# Patient Record
Sex: Female | Born: 1975 | Race: Black or African American | Hispanic: No | Marital: Single | Smoking: Former smoker
Health system: Southern US, Community
[De-identification: ages and names within clinical notes are randomized; demographics above are authoritative.]

## PROBLEM LIST (undated history)

## (undated) DIAGNOSIS — IMO0002 Reserved for concepts with insufficient information to code with codable children: Secondary | ICD-10-CM

## (undated) DIAGNOSIS — IMO0001 Reserved for inherently not codable concepts without codable children: Secondary | ICD-10-CM

## (undated) DIAGNOSIS — G43909 Migraine, unspecified, not intractable, without status migrainosus: Secondary | ICD-10-CM

## (undated) DIAGNOSIS — C801 Malignant (primary) neoplasm, unspecified: Secondary | ICD-10-CM

## (undated) DIAGNOSIS — G43019 Migraine without aura, intractable, without status migrainosus: Secondary | ICD-10-CM

## (undated) DIAGNOSIS — K219 Gastro-esophageal reflux disease without esophagitis: Secondary | ICD-10-CM

## (undated) DIAGNOSIS — Z972 Presence of dental prosthetic device (complete) (partial): Secondary | ICD-10-CM

## (undated) HISTORY — PX: DILATION AND CURETTAGE OF UTERUS: SHX78

## (undated) HISTORY — PX: MULTIPLE TOOTH EXTRACTIONS: SHX2053

## (undated) HISTORY — DX: Migraine without aura, intractable, without status migrainosus: G43.019

---

## 1997-12-01 ENCOUNTER — Inpatient Hospital Stay (HOSPITAL_COMMUNITY): Admission: AD | Admit: 1997-12-01 | Discharge: 1997-12-01 | Payer: Self-pay | Admitting: *Deleted

## 1998-02-23 ENCOUNTER — Inpatient Hospital Stay (HOSPITAL_COMMUNITY): Admission: AD | Admit: 1998-02-23 | Discharge: 1998-02-23 | Payer: Self-pay | Admitting: Obstetrics

## 1998-11-06 ENCOUNTER — Inpatient Hospital Stay (HOSPITAL_COMMUNITY): Admission: AD | Admit: 1998-11-06 | Discharge: 1998-11-06 | Payer: Self-pay | Admitting: Obstetrics & Gynecology

## 1998-11-09 ENCOUNTER — Inpatient Hospital Stay (HOSPITAL_COMMUNITY): Admission: AD | Admit: 1998-11-09 | Discharge: 1998-11-09 | Payer: Self-pay | Admitting: Obstetrics

## 1999-12-11 ENCOUNTER — Emergency Department (HOSPITAL_COMMUNITY): Admission: EM | Admit: 1999-12-11 | Discharge: 1999-12-11 | Payer: Self-pay | Admitting: Emergency Medicine

## 1999-12-11 ENCOUNTER — Encounter: Payer: Self-pay | Admitting: Emergency Medicine

## 2000-01-18 ENCOUNTER — Inpatient Hospital Stay (HOSPITAL_COMMUNITY): Admission: AD | Admit: 2000-01-18 | Discharge: 2000-01-18 | Payer: Self-pay | Admitting: *Deleted

## 2000-01-20 ENCOUNTER — Inpatient Hospital Stay (HOSPITAL_COMMUNITY): Admission: AD | Admit: 2000-01-20 | Discharge: 2000-01-20 | Payer: Self-pay | Admitting: *Deleted

## 2000-03-11 ENCOUNTER — Inpatient Hospital Stay (HOSPITAL_COMMUNITY): Admission: AD | Admit: 2000-03-11 | Discharge: 2000-03-11 | Payer: Self-pay | Admitting: *Deleted

## 2000-05-05 ENCOUNTER — Emergency Department (HOSPITAL_COMMUNITY): Admission: EM | Admit: 2000-05-05 | Discharge: 2000-05-05 | Payer: Self-pay

## 2000-05-13 ENCOUNTER — Inpatient Hospital Stay (HOSPITAL_COMMUNITY): Admission: AD | Admit: 2000-05-13 | Discharge: 2000-05-13 | Payer: Self-pay | Admitting: *Deleted

## 2000-07-19 ENCOUNTER — Inpatient Hospital Stay (HOSPITAL_COMMUNITY): Admission: AD | Admit: 2000-07-19 | Discharge: 2000-07-19 | Payer: Self-pay | Admitting: *Deleted

## 2001-02-06 ENCOUNTER — Inpatient Hospital Stay (HOSPITAL_COMMUNITY): Admission: AD | Admit: 2001-02-06 | Discharge: 2001-02-06 | Payer: Self-pay | Admitting: *Deleted

## 2001-03-21 ENCOUNTER — Inpatient Hospital Stay (HOSPITAL_COMMUNITY): Admission: AD | Admit: 2001-03-21 | Discharge: 2001-03-21 | Payer: Self-pay | Admitting: *Deleted

## 2001-04-06 ENCOUNTER — Emergency Department (HOSPITAL_COMMUNITY): Admission: EM | Admit: 2001-04-06 | Discharge: 2001-04-06 | Payer: Self-pay | Admitting: Emergency Medicine

## 2001-06-07 ENCOUNTER — Encounter: Payer: Self-pay | Admitting: Obstetrics & Gynecology

## 2001-06-07 ENCOUNTER — Inpatient Hospital Stay (HOSPITAL_COMMUNITY): Admission: AD | Admit: 2001-06-07 | Discharge: 2001-06-07 | Payer: Self-pay | Admitting: Obstetrics & Gynecology

## 2001-06-12 ENCOUNTER — Encounter (INDEPENDENT_AMBULATORY_CARE_PROVIDER_SITE_OTHER): Payer: Self-pay | Admitting: Specialist

## 2001-06-12 ENCOUNTER — Observation Stay (HOSPITAL_COMMUNITY): Admission: AD | Admit: 2001-06-12 | Discharge: 2001-06-12 | Payer: Self-pay | Admitting: Obstetrics & Gynecology

## 2001-06-12 HISTORY — PX: DILATION AND EVACUATION: SHX1459

## 2002-02-03 ENCOUNTER — Inpatient Hospital Stay (HOSPITAL_COMMUNITY): Admission: AD | Admit: 2002-02-03 | Discharge: 2002-02-03 | Payer: Self-pay | Admitting: Obstetrics and Gynecology

## 2002-02-28 ENCOUNTER — Other Ambulatory Visit: Admission: RE | Admit: 2002-02-28 | Discharge: 2002-02-28 | Payer: Self-pay | Admitting: Obstetrics and Gynecology

## 2002-09-18 ENCOUNTER — Inpatient Hospital Stay (HOSPITAL_COMMUNITY): Admission: AD | Admit: 2002-09-18 | Discharge: 2002-09-18 | Payer: Self-pay | Admitting: Obstetrics and Gynecology

## 2002-09-22 ENCOUNTER — Inpatient Hospital Stay (HOSPITAL_COMMUNITY): Admission: AD | Admit: 2002-09-22 | Discharge: 2002-09-22 | Payer: Self-pay | Admitting: Obstetrics and Gynecology

## 2002-09-25 ENCOUNTER — Inpatient Hospital Stay (HOSPITAL_COMMUNITY): Admission: AD | Admit: 2002-09-25 | Discharge: 2002-09-27 | Payer: Self-pay | Admitting: Obstetrics and Gynecology

## 2003-05-08 ENCOUNTER — Other Ambulatory Visit: Admission: RE | Admit: 2003-05-08 | Discharge: 2003-05-08 | Payer: Self-pay | Admitting: Obstetrics and Gynecology

## 2003-07-07 ENCOUNTER — Inpatient Hospital Stay (HOSPITAL_COMMUNITY): Admission: AD | Admit: 2003-07-07 | Discharge: 2003-07-07 | Payer: Self-pay | Admitting: Obstetrics and Gynecology

## 2003-07-14 ENCOUNTER — Emergency Department (HOSPITAL_COMMUNITY): Admission: EM | Admit: 2003-07-14 | Discharge: 2003-07-14 | Payer: Self-pay | Admitting: Emergency Medicine

## 2003-11-27 ENCOUNTER — Emergency Department (HOSPITAL_COMMUNITY): Admission: EM | Admit: 2003-11-27 | Discharge: 2003-11-28 | Payer: Self-pay

## 2004-01-13 ENCOUNTER — Emergency Department (HOSPITAL_COMMUNITY): Admission: EM | Admit: 2004-01-13 | Discharge: 2004-01-14 | Payer: Self-pay | Admitting: Emergency Medicine

## 2004-04-22 ENCOUNTER — Emergency Department (HOSPITAL_COMMUNITY): Admission: EM | Admit: 2004-04-22 | Discharge: 2004-04-22 | Payer: Self-pay

## 2004-12-01 ENCOUNTER — Inpatient Hospital Stay (HOSPITAL_COMMUNITY): Admission: AD | Admit: 2004-12-01 | Discharge: 2004-12-01 | Payer: Self-pay | Admitting: Family Medicine

## 2005-02-03 ENCOUNTER — Inpatient Hospital Stay (HOSPITAL_COMMUNITY): Admission: AD | Admit: 2005-02-03 | Discharge: 2005-02-03 | Payer: Self-pay | Admitting: Family Medicine

## 2005-08-24 ENCOUNTER — Inpatient Hospital Stay (HOSPITAL_COMMUNITY): Admission: AD | Admit: 2005-08-24 | Discharge: 2005-08-24 | Payer: Self-pay | Admitting: Obstetrics

## 2005-09-24 ENCOUNTER — Inpatient Hospital Stay (HOSPITAL_COMMUNITY): Admission: AD | Admit: 2005-09-24 | Discharge: 2005-09-26 | Payer: Self-pay | Admitting: Obstetrics

## 2005-09-24 ENCOUNTER — Encounter (INDEPENDENT_AMBULATORY_CARE_PROVIDER_SITE_OTHER): Payer: Self-pay | Admitting: *Deleted

## 2005-09-25 ENCOUNTER — Encounter (INDEPENDENT_AMBULATORY_CARE_PROVIDER_SITE_OTHER): Payer: Self-pay | Admitting: Specialist

## 2005-09-25 HISTORY — PX: TUBAL LIGATION: SHX77

## 2006-04-08 ENCOUNTER — Emergency Department (HOSPITAL_COMMUNITY): Admission: EM | Admit: 2006-04-08 | Discharge: 2006-04-08 | Payer: Self-pay | Admitting: Emergency Medicine

## 2006-11-23 ENCOUNTER — Emergency Department (HOSPITAL_COMMUNITY): Admission: EM | Admit: 2006-11-23 | Discharge: 2006-11-23 | Payer: Self-pay | Admitting: Emergency Medicine

## 2007-04-28 ENCOUNTER — Emergency Department (HOSPITAL_COMMUNITY): Admission: EM | Admit: 2007-04-28 | Discharge: 2007-04-29 | Payer: Self-pay | Admitting: Emergency Medicine

## 2007-07-31 ENCOUNTER — Emergency Department (HOSPITAL_COMMUNITY): Admission: EM | Admit: 2007-07-31 | Discharge: 2007-07-31 | Payer: Self-pay | Admitting: Emergency Medicine

## 2008-06-18 ENCOUNTER — Emergency Department (HOSPITAL_COMMUNITY): Admission: EM | Admit: 2008-06-18 | Discharge: 2008-06-19 | Payer: Self-pay | Admitting: Emergency Medicine

## 2008-09-21 ENCOUNTER — Emergency Department (HOSPITAL_COMMUNITY): Admission: EM | Admit: 2008-09-21 | Discharge: 2008-09-21 | Payer: Self-pay | Admitting: Emergency Medicine

## 2008-10-07 ENCOUNTER — Emergency Department (HOSPITAL_COMMUNITY): Admission: EM | Admit: 2008-10-07 | Discharge: 2008-10-07 | Payer: Self-pay | Admitting: Emergency Medicine

## 2009-01-07 ENCOUNTER — Emergency Department (HOSPITAL_COMMUNITY): Admission: EM | Admit: 2009-01-07 | Discharge: 2009-01-07 | Payer: Self-pay | Admitting: Emergency Medicine

## 2009-12-02 ENCOUNTER — Emergency Department (HOSPITAL_COMMUNITY): Admission: EM | Admit: 2009-12-02 | Discharge: 2009-12-02 | Payer: Self-pay | Admitting: Emergency Medicine

## 2011-02-25 NOTE — Op Note (Signed)
Gail Tucker, URIEGAS NO.:  [id number]   MEDICAL RECORD NO.:  [id number]          PATIENT TYPE:  INP   LOCATION:                                FACILITY:  [facility]   PHYSICIAN:  Kathreen Cosier, M.D.DATE OF BIRTH:  1976-01-19   DATE OF PROCEDURE:  09/25/2005  DATE OF DISCHARGE:                                 OPERATIVE REPORT   PREOPERATIVE DIAGNOSIS:  Multiparity.   OPERATION/PROCEDURE:  Postpartum tubal ligation.   DESCRIPTION OF PROCEDURE:   PREOPERATIVE DIAGNOSIS:  Multiparity.   OPERATION/PROCEDURE:  Postpartum tubal ligation.   ANESTHESIA:  Epidural.   DESCRIPTION OF PROCEDURE:  Using epidural, the patient in the supine  position, abdomen was prepped and draped. Bladder emptied with straight  catheter.  Midline subumbilical incision one inch long was made, carried  down to the fascia.  Fascia was cleaned and grasped with two Kochers.  The  fascia and peritoneum opened with Mayo scissors.  The left tube was grasped  in the mid portion with the Babcock clamp.  A 0 plain suture was placed on  the mesosalpinx below the portion of the tube within the clamp and tied and  one inch of tube transected.  Hemostasis satisfactory.  Procedure was done  in the similar fashion on the other side.  Abdomen closed in layers.  Peritoneum and fascia with continuous suture with 0 Dexon.  Skin closed with  subcuticular stitch of 4-0 Monocryl.  Blood loss minimal.  The patient  tolerated the procedure well and taken to the recovery room in good  condition.           ______________________________  Kathreen Cosier, M.D.     BAM/MEDQ  D:  09/25/2005  T:  09/26/2005  Job:  [id number]

## 2011-02-25 NOTE — Op Note (Signed)
[facility]  Patient:    Gail Tucker, Gail Tucker Visit Number: [id number] MRN: [id number]          Service Type: Attending:  Naima A. Normand Sloop, M.D. Dictated by:   Pierre Bali. Normand Sloop, M.D. Proc. Date: 06/12/01                             Operative Report  PREOPERATIVE DIAGNOSIS:       Missed abortion, rule out possible ectopic pregnancy.  POSTOPERATIVE DIAGNOSIS:      Missed abortion.  PROCEDURE:                    Dilation and evacuation.  SURGEON:                      Naima A. Normand Sloop, M.D.  ANESTHESIA:                   MAC and cervical block using 6 cc of                               2% lidocaine.  ESTIMATED BLOOD LOSS:         Minimal.  INTRAVENOUS FLUIDS:           1200 cc crystalloid.  COMPLICATIONS:                None.  FINDINGS:                     A 7-week size uterus with no adnexal masses, positive chorionic villi on frozen section, was sent to pathology.  DESCRIPTION OF PROCEDURE:     The patient was taken to the operating room where she was given MAC anesthesia and placed in the dorsal lithotomy position and prepped and draped in the normal sterile fashion. A bivalve speculum was then placed into the vagina. The anterior lip of the cervix was grasped with a single-tooth tenaculum. Before all this was done, the bladder was drained and the patient was found to have a 7-week anteverted uterus. The cervix was then dilated with [facility] dilators. The size 7 suction curettage was then placed into the uterus and products of conception were contained until gritty texture was noted. A sharp curettage was then placed into the uterus and once again suction curettage was done until a gritty texture was noted. All instruments were removed from the vagina. The pathologist called and said there were chorionic villi on the pathology. Sponge counts were correct x 2. The patient went to recovery room in stable condition. Dictated by:   Pierre Bali. Normand Sloop,  M.D. Attending:  Naima A. Dillard, M.D. DD:  06/12/01 TD:  06/12/01 Job: [id number]

## 2011-02-25 NOTE — H&P (Signed)
NAME:  Gail Tucker, Gail Tucker                        ACCOUNT NO.:  [id number]   MEDICAL RECORD NO.:  [id number]                   PATIENT TYPE:  INP   LOCATION:  9136                                 FACILITY:  [facility]   PHYSICIAN:  Crist Fat. Rivard, M.D.              DATE OF BIRTH:  Apr 19, 1976   DATE OF ADMISSION:  09/25/2002  DATE OF DISCHARGE:                                HISTORY & PHYSICAL   REASON FOR ADMISSION:  The patient is a 35 year old gravida 3, para 1-0-1-1  at approximately 41 weeks who presents today for admission secondary to a  nonreactive NST at the office with mild variable decelerations and post  dates.  She was seen at the office today for a routine visit.  She had a  nonstress test that showed essentially a negative contraction stress test  but did show a fetal heart rate baseline of 115-120 with no variability and  mild variables noted with some activity.  The patient's cervix was 2-3 cm,  80% vertex at a minus one station.  She was therefore admitted for labor  induction.  Pregnancy has been remarkable for (1) questionable last  menstrual period, (2) smoker, (3) desires tubal sterilization with papers  signed on July 22, 2002.   PRENATAL LABORATORY DATA:  Blood type O positive, Rh antibody negative, VDRL  nonreactive, rubella titer positive, hepatitis B surface antigen negative.  HIV nonreactive, sickle cell test negative.  Pap was normal.  Glucose  challenge was normal. AFP was normal.  Hemoglobin upon entering the practice  was 12.4.  It was 10.5 at 27 weeks.  EDC of September 18, 2002 was  established by last menstrual period and was in essential agreement with  ultrasound at 18 and 33 weeks.  Group B strep culture was negative at 36  weeks.  GC and Chlamydia cultures were negative in May.   HISTORY OF PRESENT PREGNANCY:  The patient entered care at approximately 7  weeks.  She had a normal ultrasound at 19 weeks.  She did have echogenic  intracardiac focus  noted but no other finding.  No followup was needed.  She  was smoking approximately 4 cigarettes a day.  She signed tubal papers at 33  weeks.  She also had an ultrasound at that time secondary to low body mass  index and smoking.  Estimated fetal weight was in the 75th to 90th  percentile.  The echogenic intracardiac focus was still persistent.  The  rest of her pregnancy was essentially uncomplicated.  She had been 2.5 cm  for several days from an evaluation in Maternity Admissions over the  weekend.   OBSTETRICAL HISTORY:  In 1998, she had a vaginal birth of a female infant,  weight 6 pounds 15 ounces at [redacted] weeks gestation.  She was in labor 12-1/2  hours.  She had no anesthesia.  In September of 2002, she had a first  trimester miscarriage and had a D&C.   PAST MEDICAL HISTORY:  She was on Depo-Provera in the past and stopped  approximately three years ago.  She has been using condoms since then.  She  had one abnormal Pap but the followup was normal.  She has had several  Bartholin cysts with I&D's done on them.  She has had Trichomonas in the  past.  She reports the usual childhood illnesses.  She has had one UTI.  She  has had a history of migraine headaches in the past but none in the last two  years.   PAST SURGICAL HISTORY:  Previously-noted D&C and wisdom teeth removed.  Her  only other hospitalization was for childbirth.   ALLERGIES:  TETRACYCLINE which causes nausea and vomiting.   FAMILY HISTORY:  Her maternal grandmother had chronic hypertension.  Maternal grandfather was an adult onset diabetic.  Father is deceased but  had migraine headaches.  Father was also a drug user and did pass away from  AIDS.   GENETIC HISTORY:  Unremarkable.   SOCIAL HISTORY:  The patient is single.  The father of the baby is involved  and supportive.  He is not currently present with her however.  His name is  Henry Schein.  The patient is African-American.  She is being followed by   the Certified Nurse Midwife Service at [facility].  She denies any  alcohol or drug use during this pregnancy but has been a less than 1/2 pack  per day smoker.   PHYSICAL EXAMINATION:  VITAL SIGNS:  Stable.  The patient is afebrile.  HEENT:  Within normal limits.  LUNGS:  Bilateral breath sounds are clear.  HEART:  Regular rate and rhythm without murmur.  BREASTS:  Soft and nontender.  ABDOMEN:  Fundal height is approximately 39 cm, estimated fetal weight 7 to  8 pounds.  Uterine contractions are every 4 minutes but very mild.  Fetal  heart rate is in the 120s.  NST shows no reactivity and occasional mild  variables although no late decelerations are noted.  EXTREMITIES:  Deep tendon reflexes are 2+ without clonus.  There is a trace  edema noted.   IMPRESSION:  1. Intrauterine pregnancy at 41 weeks.  2. Nonreactive nonstress test with minimal variability.  3. Negative group B strep.  4. Desires tubal.   PLAN:  1. Admit to [facility] for consult with Dois Davenport A. Rivard, M.D. as     attending physician.  2. Routine Certified Nurse Midwife orders.  3. Anticipate possible artificial rupture of membranes and/or Pitocin     induction based upon further assessment of the patient's status once the     patient is admitted.  4. Plan tubal sterilization after delivery.     Renaldo Reel Emilee Hero, C.N.M.                   Crist Fat Rivard, M.D.    Gail Tucker  D:  09/26/2002  T:  09/26/2002  Job:  [id number]

## 2011-02-25 NOTE — Discharge Summary (Signed)
Gail Tucker, DRUM NO.:  [id number]   MEDICAL RECORD NO.:  [id number]          PATIENT TYPE:  INP   LOCATION:  9106                          FACILITY:  [facility]   PHYSICIAN:  Kathreen Cosier, M.D.DATE OF BIRTH:  29-Aug-1976   DATE OF ADMISSION:  09/24/2005  DATE OF DISCHARGE:                                 DISCHARGE SUMMARY   The patient is a 35 year old gravida 4, para 2-0-1-2, [facility] October 08, 2005.  She was admitted in labor.  Cervix 8 cm, 90%, vertex -1.  She had a rapid  progress and made a normal vaginal delivery of a female.  Apgar 8 and 9.  She had meconium stained fluid and the baby was __________  prior to  delivery of the shoulders.  There was a nuchal cord, which was loose, and  then the placenta was sent to pathology.  On admission, her hemoglobin was  11.4, platelet 140.  Postop 110.1 and platelet 120.  RPR was negative,  urinalysis was negative and HIV was negative.  On September 25, 2005-the  patient underwent postpartum tubal ligation, and was discharged home on the  second postpartum day, September 26, 2005 with a hemoglobin of 10.1.  To see  me in six weeks.   DISCHARGE DIAGNOSES:  1.  Status post normal vaginal delivery at 37+ weeks.  2.  Postpartum tubal ligation.           ______________________________  Kathreen Cosier, M.D.     BAM/MEDQ  D:  09/26/2005  T:  09/26/2005  Job:  [id number]

## 2013-01-15 ENCOUNTER — Emergency Department (HOSPITAL_COMMUNITY)
Admission: EM | Admit: 2013-01-15 | Discharge: 2013-01-15 | Disposition: A | Discharge status: Home / Self Care | Payer: Medicaid Other | Attending: Emergency Medicine | Admitting: Emergency Medicine

## 2013-01-15 ENCOUNTER — Encounter (HOSPITAL_COMMUNITY): Payer: Self-pay

## 2013-01-15 DIAGNOSIS — F17200 Nicotine dependence, unspecified, uncomplicated: Secondary | ICD-10-CM | POA: Insufficient documentation

## 2013-01-15 DIAGNOSIS — Z79899 Other long term (current) drug therapy: Secondary | ICD-10-CM | POA: Insufficient documentation

## 2013-01-15 DIAGNOSIS — K644 Residual hemorrhoidal skin tags: Secondary | ICD-10-CM

## 2013-01-15 MED ORDER — PHENYLEPHRINE IN HARD FAT 0.25 % RE SUPP: 1.0000 | Freq: Two times a day (BID) | RECTAL | Status: DC

## 2013-01-15 MED ORDER — LIDOCAINE HCL 2 % EX GEL: CUTANEOUS | Status: DC | PRN

## 2013-01-15 NOTE — ED Provider Notes (Signed)
History     CSN: [id number]  Arrival date & time 01/15/13  0945   First MD Initiated Contact with Patient 01/15/13 1102      Chief Complaint  Patient presents with  . Hemorrhoids    (Consider location/radiation/quality/duration/timing/severity/associated sxs/prior treatment) HPI  Patient presents to the emergency department with complaints of sensation of meat hanging out of her rectum. She said that while she was using the bathroom yesterday she felt something come out and noticed a little bit of blood when she wiped. She did not attempt to push them back and side. She says that it has been on feeling more so than pain he denies having any diarrhea, fevers, nausea, vomiting, diarrhea. She denies any history of having hemorrhoids in the past. She's had 3 kids.  History reviewed. No pertinent past medical history.  Past Surgical History  Procedure Laterality Date  . Tubal ligation      No family history on file.  History  Substance Use Topics  . Smoking status: Current Every Day Smoker  . Smokeless tobacco: Not on file  . Alcohol Use: No    OB History   Grav Para Term Preterm Abortions TAB SAB Ect Mult Living                  Review of Systems  All other systems reviewed and are negative.    Allergies  Tetracyclines & related  Home Medications   Current Outpatient Rx  Name  Route  Sig  Dispense  Refill  . megestrol (MEGACE) 40 MG/ML suspension   Oral   Take 200 mg by mouth 2 (two) times daily.         Marland Kitchen lidocaine (XYLOCAINE JELLY) 2 % jelly   Topical   Apply topically as needed.   30 mL   0   . phenylephrine (,USE FOR PREPARATION-H,) 0.25 % suppository   Rectal   Place 1 suppository rectally 2 (two) times daily.   12 suppository   0     BP 114/82  Pulse 77  Temp(Src) 99.3 F (37.4 C) (Oral)  Resp 20  SpO2 100%  Physical Exam  Nursing note and vitals reviewed. Constitutional: She appears well-developed and well-nourished. No distress.   HENT:  Head: Normocephalic and atraumatic.  Eyes: Pupils are equal, round, and reactive to light.  Neck: Normal range of motion. Neck supple.  Cardiovascular: Normal rate and regular rhythm.   Pulmonary/Chest: Effort normal.  Abdominal: Soft.  Genitourinary: Rectal exam shows external hemorrhoid and tenderness. Guaiac negative stool.  Neurological: She is alert.  Skin: Skin is warm and dry.    ED Course  Procedures (including critical care time)  Labs Reviewed - No data to display No results found.   1. External hemorrhoid       MDM  Attempted to reduce external hemorrhoids. One of them came back out. Rx lido jelly, preparation H and Tucks. Referral to GI. No concerning symptoms at this time. VSS  Pt has been advised of the symptoms that warrant their return to the ED. Patient has voiced understanding and has agreed to follow-up with the PCP or specialist.        Dorthula Matas, PA-C 01/15/13 1210

## 2013-01-15 NOTE — ED Notes (Signed)
Pt states last pm had a large amount of BRB in toilet, no BM, states "lumps around my rectum on outside", no hx of same, states rectum very tender

## 2013-01-15 NOTE — ED Provider Notes (Signed)
Medical screening examination/treatment/procedure(s) were performed by non-physician practitioner and as supervising physician I was immediately available for consultation/collaboration.    Vida Roller, MD 01/15/13 2115

## 2015-03-10 ENCOUNTER — Encounter (HOSPITAL_COMMUNITY): Payer: Self-pay | Admitting: Emergency Medicine

## 2015-03-10 ENCOUNTER — Emergency Department (HOSPITAL_COMMUNITY)
Admission: EM | Admit: 2015-03-10 | Discharge: 2015-03-10 | Disposition: A | Discharge status: Home / Self Care | Payer: Medicaid Other | Attending: Emergency Medicine | Admitting: Emergency Medicine

## 2015-03-10 DIAGNOSIS — Z720 Tobacco use: Secondary | ICD-10-CM | POA: Insufficient documentation

## 2015-03-10 DIAGNOSIS — L729 Follicular cyst of the skin and subcutaneous tissue, unspecified: Secondary | ICD-10-CM | POA: Diagnosis present

## 2015-03-10 DIAGNOSIS — Z79899 Other long term (current) drug therapy: Secondary | ICD-10-CM | POA: Insufficient documentation

## 2015-03-10 MED ORDER — SULFAMETHOXAZOLE-TRIMETHOPRIM 800-160 MG PO TABS: 1.0000 | ORAL_TABLET | Freq: Two times a day (BID) | ORAL | Status: AC

## 2015-03-10 NOTE — Discharge Instructions (Signed)
Return to the emergency room with worsening of symptoms, new symptoms or with symptoms that are concerning, especially fevers, redness, swelling, red streaks from the area, generalized unwell. Call to make follow up appointment with surgery Read below information and follow recommendations.  Excision of Skin Lesions Excision of a skin lesion refers to the removal of a section of skin by making small cuts (incisions) in the skin. This is typically done to remove a cancerous growth (basal cell carcinoma, squamous cell carcinoma, or melanoma) or a noncancerous growth (cyst). It may be done to treat or prevent cancer or infection. It may also be done to improve cosmetic appearance (removal of mole, skin tag). LET YOUR CAREGIVER KNOW ABOUT:   Allergies to food or medicine.  Medicines taken, including vitamins, herbs, eyedrops, over-the-counter medicines, and creams.  Use of steroids (by mouth or creams).  Previous problems with anesthetics or numbing medicines.  History of bleeding problems or blood clots.  History of any prostheses.  Previous surgery.  Other health problems, including diabetes and kidney problems.  Possibility of pregnancy, if this applies. RISKS AND COMPLICATIONS  Many complications can be managed. With appropriate treatment and rehabilitation, the following complications are very uncommon:  Bleeding.  Infection.  Scarring.  Recurrence of cyst or cancer.  Changes in skin sensation or appearance (discoloration, swelling).  Reaction to anesthesia.  Allergic reaction to surgical materials or ointments.  Damage to nerves, blood vessels, muscles, or other structures.  Continued pain. BEFORE THE PROCEDURE  It is important to follow your caregiver's instructions prior to your procedure to avoid complications. Steps before your procedure may include:  Physical exam, blood tests, other procedures, such as removing a small sample for examination under a microscope  (biopsy).  Your caregiver may review the procedure, the anesthesia being used, and what to expect after the procedure with you. You may be asked to:  Stop taking certain medicines, such as blood thinners (including aspirin, clopidogrel, ibuprofen), for several days prior to your procedure.  Take certain medicines.  Stop smoking. It is a good idea to arrange for a ride home after surgery and to have someone to help you with activities during recovery. PROCEDURE  There are several excision techniques. The type of excision or surgical technique used will depend on your condition, the location of the lesion, and your overall health. After the lesion is sterilized and a local anesthetic is applied, the following may be performed: Complete surgical excision The area to be removed is marked with a pen. Using a small scalpel and scissors, the surgeon gently cuts around and under the lesion until it is completely removed. The lesion is placed in a special fluid and sent to the lab for examination. If necessary, bleeding will be controlled with a device that delivers heat. The edges of the wound are stitched together and a dressing is applied. This procedure may be performed to treat a cancerous growth or noncancerous cyst or lesion. Surgeons commonly perform an elliptical excision, to minimize scarring. Excision of a cyst The surgeon makes an incision on the cyst. The entire cyst is removed through the incision. The wound may be closed with a suture (stitch). Shave excision During shave excision, the surgeon uses a small blade or loop instrument to shave off the lesion. This may be done to remove a mole or skin tag. The wound is usually left to heal on its own without stitches. Punch excision During punch excision, the surgeon uses a small, round tool (like  a cookie cutter) to cut a circle shape out of the skin. The outer edges of the skin are stitched together. This may be done to remove a mole or scar  or to perform a biopsy of the lesion. Mohs micrographic surgery During Mohs micrographic surgery, layers of the lesion are removed with a scalpel or loop instrument and immediately examined under a microscope until all of the abnormal or cancerous tissue is removed. This procedure is minimally invasive and ensures the best cosmetic outcome, with removal of as little normal tissue as possible. Mohs is usually done to treat skin cancer, such as basal cell carcinoma or squamous cell carcinoma, particularly on the face and ears. Antibiotic ointment is applied to the surgical area after each of the procedures listed above, as necessary. AFTER THE PROCEDURE  How well you heal depends on many factors. Most patients heal quite well with proper techniques and self-care. Scarring will lessen over time. HOME CARE INSTRUCTIONS   Take medicines for pain as directed.  Keep the incision area clean, dry, and protected for at least 48 hours. Change dressings as directed.  For bleeding, apply gentle but firm pressure to the wound using a folded towel for 20 minutes. Call your caregiver if bleeding does not stop.  Avoid high-impact exercise and activities until the stitches are removed or the area heals.  Follow your caregiver's instructions to minimize scarring. Avoid sun exposure until the area has healed. Scarring should lessen over time.  Follow up with your caregiver as directed. Removal of stitches within 4 to 14 days may be necessary. Finding out the results of your test Not all test results are available during your visit. If your test results are not back during the visit, make an appointment with your caregiver to find out the results. Do not assume everything is normal if you have not heard from your caregiver or the medical facility. It is important for you to follow up on all of your test results. SEEK MEDICAL CARE IF:   You or your child has an oral temperature above 102 F (38.9 C).  You  develop signs of infection (chills, feeling unwell).  You notice bleeding, pain, discharge, redness, or swelling at the incision site.  You notice skin irregularities or changes in sensation. MAKE SURE YOU:   Understand these instructions.  Will watch your condition.  Will get help right away if you are not doing well or get worse. FOR MORE INFORMATION  American Academy of [employer]icians: www.AromatherapyParty.no American Academy of Dermatology: http://jones-macias.info/ Document Released: 12/21/2009 Document Revised: 12/19/2011 Document Reviewed: 12/21/2009 [facility] Patient Information 2015 [place], [place]. This information is not intended to replace advice given to you by your health care provider. Make sure you discuss any questions you have with your health care provider.

## 2015-03-10 NOTE — ED Notes (Signed)
Pt states that she has cyst on right shoulder that had been cut on in the past by one of her doctors, that occasionally get red and inflamed and painful. Pt states that her PCP wont touch it. Pt denies it ever having any drainage.

## 2015-03-10 NOTE — ED Provider Notes (Signed)
CSN: [id number]     Arrival date & time 03/10/15  1217 History  This chart was scribed for a non-physician practitioner, Al Corpus, PA-C working with Elnora Morrison, MD by Martinique Peace, ED Scribe. The patient was seen in W[facility]. The patient's care was started at 2:17 PM.    Chief Complaint  Patient presents with  . Cyst      The history is provided by the patient. No language interpreter was used.    HPI Comments: Gail Tucker is a 39 y.o. female who presents to the Emergency Department complaining of recurrent cyst to posterior aspect of right shoulder that was last cut 3 years ago in efforts to try and remove it. Pt reports current flare-up has caused an increase in inflammation, redness, and pain. She also complains of experiencing chills. No complaints of fever, nausea, or vomiting. She notes she has tried taking Tylenol and Ibuprofen for pain but explains she is not trying to take too much of it. Pt is current everyday smoker.    History reviewed. No pertinent past medical history. Past Surgical History  Procedure Laterality Date  . Tubal ligation    . Dilation and curettage of uterus     No family history on file. History  Substance Use Topics  . Smoking status: Current Every Day Smoker    Types: Cigarettes  . Smokeless tobacco: Not on file  . Alcohol Use: No   OB History    No data available     Review of Systems  Constitutional: Positive for chills. Negative for fever.  Gastrointestinal: Negative for nausea and vomiting.  Skin: Positive for color change and wound. Negative for rash.       Cyst to right shoulder with pain and redness.       Allergies  Tetracyclines & related  Home Medications   Prior to Admission medications   Medication Sig Start Date End Date Taking? Authorizing Provider  lidocaine (XYLOCAINE JELLY) 2 % jelly Apply topically as needed. 01/15/13   Tiffany Carlota Raspberry, PA-C  megestrol (MEGACE) 40 MG/ML suspension Take 200 mg by mouth 2  (two) times daily.    Historical Provider, MD  phenylephrine (,USE FOR PREPARATION-H,) 0.25 % suppository Place 1 suppository rectally 2 (two) times daily. 01/15/13   Tiffany Carlota Raspberry, PA-C  sulfamethoxazole-trimethoprim (BACTRIM DS,SEPTRA DS) 800-160 MG per tablet Take 1 tablet by mouth 2 (two) times daily. 03/10/15 03/17/15  Al Corpus, PA-C   BP 122/88 mmHg  Pulse 93  Temp(Src) 98.1 F (36.7 C) (Oral)  Resp 17  SpO2 99% Physical Exam  Constitutional: She appears well-developed and well-nourished. No distress.  HENT:  Head: Normocephalic and atraumatic.  Eyes: Conjunctivae are normal. Right eye exhibits no discharge. Left eye exhibits no discharge.  Pulmonary/Chest: Effort normal. No respiratory distress.  Musculoskeletal: She exhibits no edema.  Neurological: She is alert. Coordination normal.  Skin: She is not diaphoretic. There is erythema.  2cm circular lesion to right upper back with erythema, no surrounding edema. Area is not fluctuant and no red streaking.   Psychiatric: She has a normal mood and affect. Her behavior is normal.  Nursing note and vitals reviewed.   ED Course  Procedures (including critical care time) Labs Review Labs Reviewed - No data to display  Imaging Review No results found.   EKG Interpretation None     Medications - No data to display  2:21 PM- Treatment plan was discussed with patient who verbalizes understanding and agrees.   MDM  Final diagnoses:  Cyst of skin   Patient with recurrent cyst to right upper back with erythema without surrounding signs of cellulitis. Area is not fluctuant. There is no heave that I D. Patient given prescription for antibiotics. She's been given a referral to general surgery for surgical excision.  Discussed return precautions with patient. Discussed all results and patient verbalizes understanding and agrees with plan.  I personally performed the services described in this documentation, which was scribed  in my presence. The recorded information has been reviewed and is accurate.   Al Corpus, PA-C 03/10/15 1545  Elnora Morrison, MD 03/10/15 [phone]

## 2015-03-12 NOTE — Progress Notes (Signed)
Pt called [facility] ED registration staff who referred her to [name] ED charge RN and CM Pt requesting assist with referral provided to her from [facility] ED on 03/10/15 to [facility] for abscess removal Pt returns call to confirm pcp is Gracy Racer (OBGYN) per medicaid card and when she called [facility] she stated they refused to see her with [place] listed as he pcp.   Charge RN answered questions for pt, discussed that the [facility] EDP/NP/PA will not remove the cyst in ED if a recommendation was for a surgeon to remove it, discussed EDPs are not pcps and provided options 1) go to medicaid pcp and have him make a referral if possible 2) speak with DSS- medicaid staff to get a different pcp and have DSS notify/fax [facility] the new pcp information 3) see if  [facility] will allow her to be billed as a self pay pt and pay out of pocket for cyst removal  Discussed with EDP Surveyor, quantity, ([place])

## 2015-04-20 ENCOUNTER — Ambulatory Visit: Payer: Self-pay | Admitting: Surgery

## 2015-04-20 NOTE — H&P (Signed)
Gail Tucker 04/20/2015 10:44 AM Location: [facility] Patient #: [id number] DOB: 10-02-1976 Single / Language: [name] / Race: Black or African American Female History of Present Illness Gail Moores A. Gail Schlup MD; 04/20/2015 11:56 AM) Patient words: cyst back neck   PT PRESENTS WITH INFLAMMED CYST OVER RIGHT UPPER BACK. IT HAS BEEN PRESENT FOR MANY YEARS AND IS CHRONICALLY INFLAMMED. IT WAS DRAINED A FEW YEARS AGO ACCORDING TO THE PATIENT BUT NEVER REMOVED. SHE HAS BEEN ON BACTRIM RECENTLY. IT HAS BEEN SORE FOR MONTHS. NO DRAINAGE NO FEVER OR CHILLS.  The patient is a 39 year old female   Allergies [name], [name]; 04/20/2015 10:46 AM) Tetracycline *CHEMICALS*  Medication History [name], [name]; 04/20/2015 10:46 AM) Megestrol Acetate (40MG /ML Suspension, Oral) Active. Medications Reconciled  Social History [name], [name]; 04/20/2015 10:50 AM) Tobacco use Current every day smoker.     Review of Systems ([name]; 04/20/2015 10:53 AM) General Present- Night Sweats. Not Present- Appetite Loss, Chills, Fatigue, Fever, Weight Gain and Weight Loss. HEENT Present- Seasonal Allergies. Not Present- Earache, Hearing Loss, Hoarseness, Nose Bleed, Oral Ulcers, Ringing in the Ears, Sinus Pain, Sore Throat, Visual Disturbances, Wears glasses/contact lenses and Yellow Eyes. Gastrointestinal Present- Bloating and Excessive gas. Not Present- Abdominal Pain, Bloody Stool, Change in Bowel Habits, Chronic diarrhea, Constipation, Difficulty Swallowing, Gets full quickly at meals, Hemorrhoids, Indigestion, Nausea, Rectal Pain and Vomiting. Endocrine Present- Excessive Hunger. Not Present- Cold Intolerance, Hair Changes, Heat Intolerance, Hot flashes and New Diabetes. Hematology Present- Gland problems. Not Present- Easy Bruising, Excessive bleeding, HIV and Persistent Infections.  Vitals [name]; 04/20/2015 10:44 AM) 04/20/2015 10:44  AM Weight: 107.13 lb Height: 63in Body Surface Area: 1.47 m Body Mass Index: 18.98 kg/m BP: 118/80 (Sitting, Left Arm, Standard)     Physical Exam ([name]; 04/20/2015 11:58 AM)  General Mental Status-Alert. General Appearance-Consistent with stated age. Hydration-Well hydrated. Voice-Normal.  Integumentary Note: 4 CM X 3 CM INFLAMMED SEBACEOUS CYST NO DRAINAGE TENDER BUT NOT FLUCTUANT    Head and Neck Head-normocephalic, atraumatic with no lesions or palpable masses. Trachea-midline. Thyroid Gland Characteristics - normal size and consistency.  Eye Eyeball - Bilateral-Extraocular movements intact. Sclera/Conjunctiva - Bilateral-No scleral icterus.  Cardiovascular Cardiovascular examination reveals -normal heart sounds, regular rate and rhythm with no murmurs and normal pedal pulses bilaterally.  Neurologic Neurologic evaluation reveals -alert and oriented x 3 with no impairment of recent or remote memory. Mental Status-Normal.  Musculoskeletal Normal Exam - Left-Upper Extremity Strength Normal and Lower Extremity Strength Normal. Normal Exam - Right-Upper Extremity Strength Normal and Lower Extremity Strength Normal.    Assessment & Plan ([name]; 04/20/2015 11:11 AM)  INFLAMED SEBACEOUS CYST (706.2  L72.3) Impression: right shoulder recommend excision as outpatient. This looks chronically inflammed. Recommend outpatient surgery. Risk f bleeding infection and poor wound healing. recommend smoking cessation.  Current Plans Pt Education - Skin Conditions: discussed with patient and provided information. Pt Education - Overview of benign lesions of the skin: discussed with patient and provided information. Started Clindamycin HCl 300MG , 1 (one) Capsule three times daily, #21, 04/20/2015, No Refill. Pt Education - [facility] Free Text Education/Instructions: discussed with patient and provided information.

## 2015-05-22 ENCOUNTER — Encounter (HOSPITAL_BASED_OUTPATIENT_CLINIC_OR_DEPARTMENT_OTHER): Payer: Self-pay | Admitting: *Deleted

## 2015-05-26 ENCOUNTER — Ambulatory Visit (HOSPITAL_BASED_OUTPATIENT_CLINIC_OR_DEPARTMENT_OTHER)
Admission: RE | Admit: 2015-05-26 | Discharge: 2015-05-26 | Disposition: A | Discharge status: Home / Self Care | Payer: Medicaid Other | Source: Ambulatory Visit | Attending: Surgery | Admitting: Surgery

## 2015-05-26 ENCOUNTER — Ambulatory Visit (HOSPITAL_BASED_OUTPATIENT_CLINIC_OR_DEPARTMENT_OTHER): Payer: Medicaid Other | Admitting: Anesthesiology

## 2015-05-26 ENCOUNTER — Encounter (HOSPITAL_BASED_OUTPATIENT_CLINIC_OR_DEPARTMENT_OTHER)
Admission: RE | Disposition: A | Discharge status: Home / Self Care | Payer: Self-pay | Source: Ambulatory Visit | Attending: Surgery

## 2015-05-26 ENCOUNTER — Encounter (HOSPITAL_BASED_OUTPATIENT_CLINIC_OR_DEPARTMENT_OTHER): Payer: Self-pay | Admitting: Anesthesiology

## 2015-05-26 DIAGNOSIS — L720 Epidermal cyst: Secondary | ICD-10-CM | POA: Diagnosis present

## 2015-05-26 DIAGNOSIS — C44692 Other specified malignant neoplasm of skin of right upper limb, including shoulder: Secondary | ICD-10-CM | POA: Diagnosis not present

## 2015-05-26 DIAGNOSIS — F17200 Nicotine dependence, unspecified, uncomplicated: Secondary | ICD-10-CM | POA: Diagnosis not present

## 2015-05-26 DIAGNOSIS — K219 Gastro-esophageal reflux disease without esophagitis: Secondary | ICD-10-CM | POA: Diagnosis not present

## 2015-05-26 HISTORY — DX: Gastro-esophageal reflux disease without esophagitis: K21.9

## 2015-05-26 HISTORY — DX: Migraine, unspecified, not intractable, without status migrainosus: G43.909

## 2015-05-26 HISTORY — PX: LIPOMA EXCISION: SHX5283

## 2015-05-26 HISTORY — DX: Presence of dental prosthetic device (complete) (partial): Z97.2

## 2015-05-26 LAB — POCT HEMOGLOBIN-HEMACUE: Hemoglobin: 13.1 g/dL (ref 12.0–15.0)

## 2015-05-26 SURGERY — EXCISION LIPOMA
Anesthesia: General | Site: Shoulder | Laterality: Right

## 2015-05-26 MED ORDER — MIDAZOLAM HCL 2 MG/2ML IJ SOLN: 1.0000 mg | INTRAMUSCULAR | Status: DC | PRN

## 2015-05-26 MED ORDER — PHENYLEPHRINE HCL 10 MG/ML IJ SOLN
INTRAMUSCULAR | Status: DC | PRN
  Administered 2015-05-26: 40 ug via INTRAVENOUS

## 2015-05-26 MED ORDER — FENTANYL CITRATE (PF) 100 MCG/2ML IJ SOLN
INTRAMUSCULAR | Status: AC
  Filled 2015-05-26: qty 6

## 2015-05-26 MED ORDER — SCOPOLAMINE 1 MG/3DAYS TD PT72: 1.0000 | MEDICATED_PATCH | Freq: Once | TRANSDERMAL | Status: DC | PRN

## 2015-05-26 MED ORDER — CEFAZOLIN SODIUM-DEXTROSE 2-3 GM-% IV SOLR
INTRAVENOUS | Status: AC
  Filled 2015-05-26: qty 50

## 2015-05-26 MED ORDER — FENTANYL CITRATE (PF) 100 MCG/2ML IJ SOLN
INTRAMUSCULAR | Status: DC | PRN
  Administered 2015-05-26: 100 ug via INTRAVENOUS

## 2015-05-26 MED ORDER — OXYCODONE HCL 5 MG/5ML PO SOLN: 5.0000 mg | Freq: Once | ORAL | Status: DC | PRN

## 2015-05-26 MED ORDER — LACTATED RINGERS IV SOLN
INTRAVENOUS | Status: DC
  Administered 2015-05-26: 11:00:00 via INTRAVENOUS

## 2015-05-26 MED ORDER — MEPERIDINE HCL 25 MG/ML IJ SOLN: 6.2500 mg | INTRAMUSCULAR | Status: DC | PRN

## 2015-05-26 MED ORDER — DEXTROSE 5 % IV SOLN
3.0000 g | INTRAVENOUS | Status: AC
  Administered 2015-05-26: 2 g via INTRAVENOUS

## 2015-05-26 MED ORDER — FENTANYL CITRATE (PF) 100 MCG/2ML IJ SOLN: 50.0000 ug | INTRAMUSCULAR | Status: DC | PRN

## 2015-05-26 MED ORDER — ONDANSETRON HCL 4 MG/2ML IJ SOLN
INTRAMUSCULAR | Status: DC | PRN
  Administered 2015-05-26: 4 mg via INTRAVENOUS

## 2015-05-26 MED ORDER — MIDAZOLAM HCL 5 MG/5ML IJ SOLN
INTRAMUSCULAR | Status: DC | PRN
Start: 2015-05-26 — End: 2015-05-26
  Administered 2015-05-26: 2 mg via INTRAVENOUS

## 2015-05-26 MED ORDER — BUPIVACAINE-EPINEPHRINE 0.25% -1:200000 IJ SOLN
INTRAMUSCULAR | Status: DC | PRN
  Administered 2015-05-26: 10 mL

## 2015-05-26 MED ORDER — MIDAZOLAM HCL 2 MG/2ML IJ SOLN
INTRAMUSCULAR | Status: AC
  Filled 2015-05-26: qty 2

## 2015-05-26 MED ORDER — LIDOCAINE HCL (CARDIAC) 20 MG/ML IV SOLN
INTRAVENOUS | Status: DC | PRN
  Administered 2015-05-26: 50 mg via INTRAVENOUS

## 2015-05-26 MED ORDER — PROMETHAZINE HCL 25 MG/ML IJ SOLN: 6.2500 mg | INTRAMUSCULAR | Status: DC | PRN

## 2015-05-26 MED ORDER — PROPOFOL 10 MG/ML IV BOLUS
INTRAVENOUS | Status: DC | PRN
  Administered 2015-05-26: 150 mg via INTRAVENOUS

## 2015-05-26 MED ORDER — OXYCODONE HCL 5 MG PO TABS: 5.0000 mg | ORAL_TABLET | Freq: Once | ORAL | Status: DC | PRN

## 2015-05-26 MED ORDER — GLYCOPYRROLATE 0.2 MG/ML IJ SOLN
INTRAMUSCULAR | Status: DC | PRN
  Administered 2015-05-26: 0.2 mg via INTRAVENOUS

## 2015-05-26 MED ORDER — DEXAMETHASONE SODIUM PHOSPHATE 4 MG/ML IJ SOLN
INTRAMUSCULAR | Status: DC | PRN
  Administered 2015-05-26: 10 mg via INTRAVENOUS

## 2015-05-26 MED ORDER — HYDROCODONE-ACETAMINOPHEN 5-325 MG PO TABS: 1.0000 | ORAL_TABLET | Freq: Four times a day (QID) | ORAL | Status: DC | PRN

## 2015-05-26 MED ORDER — GLYCOPYRROLATE 0.2 MG/ML IJ SOLN: 0.2000 mg | Freq: Once | INTRAMUSCULAR | Status: DC | PRN

## 2015-05-26 MED ORDER — HYDROMORPHONE HCL 1 MG/ML IJ SOLN: 0.2500 mg | INTRAMUSCULAR | Status: DC | PRN

## 2015-05-26 MED ORDER — CHLORHEXIDINE GLUCONATE 4 % EX LIQD: 1.0000 "application " | Freq: Once | CUTANEOUS | Status: DC

## 2015-05-26 SURGICAL SUPPLY — 45 items
APL SKNCLS STERI-STRIP NONHPOA (GAUZE/BANDAGES/DRESSINGS)
BENZOIN TINCTURE PRP APPL 2/3 (GAUZE/BANDAGES/DRESSINGS) IMPLANT
BLADE SURG 10 STRL SS (BLADE) IMPLANT
BLADE SURG 15 STRL LF DISP TIS (BLADE) ×1 IMPLANT
BLADE SURG 15 STRL SS (BLADE) ×3
CANISTER SUCT 1200ML W/VALVE (MISCELLANEOUS) ×2 IMPLANT
CHLORAPREP W/TINT 26ML (MISCELLANEOUS) ×3 IMPLANT
CLOSURE WOUND 1/2 X4 (GAUZE/BANDAGES/DRESSINGS)
COVER BACK TABLE 60X90IN (DRAPES) ×3 IMPLANT
COVER MAYO STAND STRL (DRAPES) ×3 IMPLANT
DECANTER SPIKE VIAL GLASS SM (MISCELLANEOUS) IMPLANT
DRAPE LAPAROTOMY 100X72 PEDS (DRAPES) ×3 IMPLANT
DRAPE UTILITY XL STRL (DRAPES) ×3 IMPLANT
ELECT COATED BLADE 2.86 ST (ELECTRODE) ×3 IMPLANT
ELECT REM PT RETURN 9FT ADLT (ELECTROSURGICAL) ×3
ELECTRODE REM PT RTRN 9FT ADLT (ELECTROSURGICAL) ×1 IMPLANT
GLOVE BIO SURGEON STRL SZ 6.5 (GLOVE) ×1 IMPLANT
GLOVE BIO SURGEONS STRL SZ 6.5 (GLOVE) ×1
GLOVE BIOGEL PI IND STRL 7.0 (GLOVE) IMPLANT
GLOVE BIOGEL PI IND STRL 8 (GLOVE) ×1 IMPLANT
GLOVE BIOGEL PI INDICATOR 7.0 (GLOVE) ×2
GLOVE BIOGEL PI INDICATOR 8 (GLOVE) ×2
GLOVE ECLIPSE 8.0 STRL XLNG CF (GLOVE) ×3 IMPLANT
GLOVE EXAM NITRILE EXT CUFF MD (GLOVE) ×2 IMPLANT
GOWN STRL REUS W/ TWL LRG LVL3 (GOWN DISPOSABLE) ×2 IMPLANT
GOWN STRL REUS W/TWL LRG LVL3 (GOWN DISPOSABLE) ×6
LIQUID BAND (GAUZE/BANDAGES/DRESSINGS) ×2 IMPLANT
NDL HYPO 25X1 1.5 SAFETY (NEEDLE) ×1 IMPLANT
NEEDLE HYPO 25X1 1.5 SAFETY (NEEDLE) ×3 IMPLANT
NS IRRIG 1000ML POUR BTL (IV SOLUTION) ×2 IMPLANT
PACK BASIN DAY SURGERY FS (CUSTOM PROCEDURE TRAY) ×3 IMPLANT
PENCIL BUTTON HOLSTER BLD 10FT (ELECTRODE) ×3 IMPLANT
SLEEVE SCD COMPRESS KNEE MED (MISCELLANEOUS) ×3 IMPLANT
SPONGE LAP 4X18 X RAY DECT (DISPOSABLE) ×2 IMPLANT
STAPLER VISISTAT 35W (STAPLE) IMPLANT
STRIP CLOSURE SKIN 1/2X4 (GAUZE/BANDAGES/DRESSINGS) IMPLANT
SUT MON AB 4-0 PC3 18 (SUTURE) ×3 IMPLANT
SUT VICRYL 3-0 CR8 SH (SUTURE) ×2 IMPLANT
SUT VICRYL AB 3 0 TIES (SUTURE) IMPLANT
SYR CONTROL 10ML LL (SYRINGE) ×3 IMPLANT
TOWEL OR 17X24 6PK STRL BLUE (TOWEL DISPOSABLE) ×4 IMPLANT
TOWEL OR NON WOVEN STRL DISP B (DISPOSABLE) ×1 IMPLANT
TUBE CONNECTING 20'X1/4 (TUBING) ×1
TUBE CONNECTING 20X1/4 (TUBING) ×1 IMPLANT
YANKAUER SUCT BULB TIP NO VENT (SUCTIONS) ×2 IMPLANT

## 2015-05-26 NOTE — Anesthesia Procedure Notes (Signed)
Procedure Name: LMA Insertion Date/Time: 05/26/2015 12:37 PM Performed by: Toula Moos L Pre-anesthesia Checklist: Patient identified, Emergency Drugs available, Suction available, Patient being monitored and Timeout performed Patient Re-evaluated:Patient Re-evaluated prior to inductionOxygen Delivery Method: Circle System Utilized Preoxygenation: Pre-oxygenation with 100% oxygen Intubation Type: IV induction Ventilation: Mask ventilation without difficulty LMA: LMA inserted LMA Size: 4.0 Number of attempts: 1 Airway Equipment and Method: Bite block Placement Confirmation: positive ETCO2 Tube secured with: Tape Dental Injury: Teeth and Oropharynx as per pre-operative assessment

## 2015-05-26 NOTE — Anesthesia Preprocedure Evaluation (Signed)
Anesthesia Evaluation  Patient identified by MRN, date of birth, ID band Patient awake    Reviewed: Allergy & Precautions, NPO status , Patient's Chart, lab work & pertinent test results  Airway Mallampati: II  TM Distance: >3 FB Neck ROM: Full    Dental no notable dental hx.    Pulmonary Current Smoker,  breath sounds clear to auscultation  Pulmonary exam normal       Cardiovascular negative cardio ROS Normal cardiovascular examRhythm:Regular Rate:Normal     Neuro/Psych  Headaches, negative psych ROS   GI/Hepatic Neg liver ROS, GERD-  ,  Endo/Other  negative endocrine ROS  Renal/GU negative Renal ROS     Musculoskeletal negative musculoskeletal ROS (+)   Abdominal   Peds  Hematology negative hematology ROS (+)   Anesthesia Other Findings   Reproductive/Obstetrics negative OB ROS                             Anesthesia Physical Anesthesia Plan  ASA: II  Anesthesia Plan: General   Post-op Pain Management:    Induction: Intravenous  Airway Management Planned: LMA  Additional Equipment:   Intra-op Plan:   Post-operative Plan: Extubation in OR  Informed Consent: I have reviewed the patients History and Physical, chart, labs and discussed the procedure including the risks, benefits and alternatives for the proposed anesthesia with the patient or authorized representative who has indicated his/her understanding and acceptance.   Dental advisory given  Plan Discussed with: CRNA  Anesthesia Plan Comments:         Anesthesia Quick Evaluation

## 2015-05-26 NOTE — Transfer of Care (Signed)
Immediate Anesthesia Transfer of Care Note  Patient: Gail Tucker  Procedure(s) Performed: Procedure(s): EXCISION CYST ON RIGHT SHOULDER (Right)  Patient Location: PACU  Anesthesia Type:General  Level of Consciousness: sedated  Airway & Oxygen Therapy: Patient Spontanous Breathing and Patient connected to face mask oxygen  Post-op Assessment: Report given to RN and Post -op Vital signs reviewed and stable  Post vital signs: Reviewed and stable  Last Vitals:  Filed Vitals:   05/26/15 1038  BP: 112/78  Pulse: 83  Temp: 36.8 C  Resp: 18    Complications: No apparent anesthesia complications

## 2015-05-26 NOTE — Interval H&P Note (Signed)
History and Physical Interval Note:  05/26/2015 12:10 PM  Gail Tucker  has presented today for surgery, with the diagnosis of Cyst on Right Shoulder  The various methods of treatment have been discussed with the patient and family. After consideration of risks, benefits and other options for treatment, the patient has consented to  Procedure(s): EXCISION CYST ON RIGHT SHOULDER (Right) as a surgical intervention .  The patient's history has been reviewed, patient examined, no change in status, stable for surgery.  I have reviewed the patient's chart and labs.  Questions were answered to the patient's satisfaction.     Hershey Knauer A.

## 2015-05-26 NOTE — Discharge Instructions (Signed)
GENERAL SURGERY: POST OP INSTRUCTIONS ° °1. DIET: Follow a light bland diet the first 24 hours after arrival home, such as soup, liquids, crackers, etc.  Be sure to include lots of fluids daily.  Avoid fast food or heavy meals as your are more likely to get nauseated.   °2. Take your usually prescribed home medications unless otherwise directed. °3. PAIN CONTROL: °a. Pain is best controlled by a usual combination of three different methods TOGETHER: °i. Ice/Heat °ii. Over the counter pain medication °iii. Prescription pain medication °b. Most patients will experience some swelling and bruising around the incisions.  Ice packs or heating pads (30-60 minutes up to 6 times a day) will help. Use ice for the first few days to help decrease swelling and bruising, then switch to heat to help relax tight/sore spots and speed recovery.  Some people prefer to use ice alone, heat alone, alternating between ice & heat.  Experiment to what works for you.  Swelling and bruising can take several weeks to resolve.   °c. It is helpful to take an over-the-counter pain medication regularly for the first few weeks.  Choose one of the following that works best for you: °i. Naproxen (Aleve, etc)  Two 220mg tabs twice a day °ii. Ibuprofen (Advil, etc) Three 200mg tabs four times a day (every meal & bedtime) °iii. Acetaminophen (Tylenol, etc) 500-650mg four times a day (every meal & bedtime) °d. A  prescription for pain medication (such as oxycodone, hydrocodone, etc) should be given to you upon discharge.  Take your pain medication as prescribed.  °i. If you are having problems/concerns with the prescription medicine (does not control pain, nausea, vomiting, rash, itching, etc), please call us [phone] to see if we need to switch you to a different pain medicine that will work better for you and/or control your side effect better. °ii. If you need a refill on your pain medication, please contact your pharmacy.  They will contact our  office to request authorization. Prescriptions will not be filled after 5 pm or on week-ends. °4. Avoid getting constipated.  Between the surgery and the pain medications, it is common to experience some constipation.  Increasing fluid intake and taking a fiber supplement (such as Metamucil, Citrucel, FiberCon, MiraLax, etc) 1-2 times a day regularly will usually help prevent this problem from occurring.  A mild laxative (prune juice, Milk of Magnesia, MiraLax, etc) should be taken according to package directions if there are no bowel movements after 48 hours.   °5. Wash / shower every day.  You may shower over the dressings as they are waterproof.  Continue to shower over incision(s) after the dressing is off. °6. Remove your waterproof bandages 5 days after surgery.  You may leave the incision open to air.  You may have skin tapes (Steri Strips) covering the incision(s).  Leave them on until one week, then remove.  You may replace a dressing/Band-Aid to cover the incision for comfort if you wish.  ° ° ° ° °7. ACTIVITIES as tolerated:   °a. You may resume regular (light) daily activities beginning the next day--such as daily self-care, walking, climbing stairs--gradually increasing activities as tolerated.  If you can walk 30 minutes without difficulty, it is safe to try more intense activity such as jogging, treadmill, bicycling, low-impact aerobics, swimming, etc. °b. Save the most intensive and strenuous activity for last such as sit-ups, heavy lifting, contact sports, etc  Refrain from any heavy lifting or straining until you   are off narcotics for pain control.   °c. DO NOT PUSH THROUGH PAIN.  Let pain be your guide: If it hurts to do something, don't do it.  Pain is your body warning you to avoid that activity for another week until the pain goes down. °d. You may drive when you are no longer taking prescription pain medication, you can comfortably wear a seatbelt, and you can safely maneuver your car and  apply brakes. °e. You may have sexual intercourse when it is comfortable.  °8. FOLLOW UP in our office °a. Please call [facility] at [phone] to set up an appointment to see your surgeon in the office for a follow-up appointment approximately 2-3 weeks after your surgery. °b. Make sure that you call for this appointment the day you arrive home to insure a convenient appointment time. °9. IF YOU HAVE DISABILITY OR FAMILY LEAVE FORMS, BRING THEM TO THE OFFICE FOR PROCESSING.  DO NOT GIVE THEM TO YOUR DOCTOR. ° ° °WHEN TO CALL US [phone]: °1. Poor pain control °2. Reactions / problems with new medications (rash/itching, nausea, etc)  °3. Fever over 101.5 F (38.5 C) °4. Worsening swelling or bruising °5. Continued bleeding from incision. °6. Increased pain, redness, or drainage from the incision °7. Difficulty breathing / swallowing ° ° The clinic staff is available to answer your questions during regular business hours (8:30am-5pm).  Please don’t hesitate to call and ask to speak to one of our nurses for clinical concerns.  ° If you have a medical emergency, go to the nearest emergency room or call 911. ° A surgeon from [facility] is always on call at the hospitals ° ° °[facility] °[address], [address], [place], [place]  [address] ? °MAIN: [phone] ? TOLL FREE: [phone] ?  °FAX [phone] °[email or web address] ° °Post Anesthesia Home Care Instructions ° °Activity: °Get plenty of rest for the remainder of the day. A responsible adult should stay with you for 24 hours following the procedure.  °For the next 24 hours, DO NOT: °-Drive a car °-Operate machinery °-Drink alcoholic beverages °-Take any medication unless instructed by your physician °-Make any legal decisions or sign important papers. ° °Meals: °Start with liquid foods such as gelatin or soup. Progress to regular foods as tolerated. Avoid greasy, spicy, heavy foods. If nausea and/or  vomiting occur, drink only clear liquids until the nausea and/or vomiting subsides. Call your physician if vomiting continues. ° °Special Instructions/Symptoms: °Your throat may feel dry or sore from the anesthesia or the breathing tube placed in your throat during surgery. If this causes discomfort, gargle with warm salt water. The discomfort should disappear within 24 hours. ° °If you had a scopolamine patch placed behind your ear for the management of post- operative nausea and/or vomiting: ° °1. The medication in the patch is effective for 72 hours, after which it should be removed.  Wrap patch in a tissue and discard in the trash. Wash hands thoroughly with soap and water. °2. You may remove the patch earlier than 72 hours if you experience unpleasant side effects which may include dry mouth, dizziness or visual disturbances. °3. Avoid touching the patch. Wash your hands with soap and water after contact with the patch. °  ° °

## 2015-05-26 NOTE — Op Note (Signed)
Preoperative diagnosis: Right shoulder epidermal inclusion cyst  Postoperative diagnosis: Same  Procedure: Excision of right shoulder epidermal inclusion cyst  Surgeon: Erroll Luna M.D.  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 This has been drained before but never excised. She has had recurrent problems with drainage from this area for at least the last year. She desires excision.The procedure has been discussed with the patient.  Alternative therapies have been discussed with the patient.  Operative risks include bleeding,  Infection,  Organ injury,  Nerve injury,  Blood vessel injury,  DVT,  Pulmonary embolism,  Death,  And possible reoperation.  Medical management risks include worsening of present situation.  The success of the procedure is 50 -90 % at treating patients symptoms.  The patient understands and agrees to proceed.  Description of procedure: Patient met in holding area the cyst over her posterior right shoulder was marked. Questions were answered. She's taken back to the operating room and placed upon the OR table on a beanbag. After induction of LMA anesthesia, she was placed with her left side down exposing the cyst over the posterior right shoulder region. This was prepped and draped in sterile fashion. Timeout was done. She received 2 g of Ancef. Curvilinear incision was made above below cyst. All tissue was excised down muscle. This was sent off as specimen. Closed after hemostasis with 3-0 Vicryl and 4-0 Monocryl. Liquid adhesive applied. All final counts found to be correct. Patient was then placed back supine extubated taken to recovery in satisfactory condition.

## 2015-05-26 NOTE — H&P (Signed)
H&P   Gail Tucker (MR# [id number])      H&P Info    Author Note Status Last Update User Last Update Date/Time   Erroll Luna, MD Signed Erroll Luna, MD 04/20/2015 11:58 AM    H&P    Expand All Collapse All   Gail Tucker 04/20/2015 10:44 AM Location: [facility] Patient #: [id number] DOB: [birth year] Single / Language: [name] / Race: Black or African American Female History of Present Illness Gail Tucker A. [name] MD; 04/20/2015 11:56 AM) Patient words: cyst back neck   PT PRESENTS WITH INFLAMMED CYST OVER RIGHT UPPER BACK. IT HAS BEEN PRESENT FOR MANY YEARS AND IS CHRONICALLY INFLAMMED. IT WAS DRAINED A FEW YEARS AGO ACCORDING TO THE PATIENT BUT NEVER REMOVED. SHE HAS BEEN ON BACTRIM RECENTLY. IT HAS BEEN SORE FOR MONTHS. NO DRAINAGE NO FEVER OR CHILLS.  The patient is a 39 year old female   Allergies [name], [name]; 04/20/2015 10:46 AM) Tetracycline *CHEMICALS*  Medication History [name], [name]; 04/20/2015 10:46 AM) Megestrol Acetate (40MG /ML Suspension, Oral) Active. Medications Reconciled  Social History [name], [name]; 04/20/2015 10:50 AM) Tobacco use Current every day smoker.     Review of Systems ([place]. Brooks CMA; 04/20/2015 10:53 AM) General Present- Night Sweats. Not Present- Appetite Loss, Chills, Fatigue, Fever, Weight Gain and Weight Loss. HEENT Present- Seasonal Allergies. Not Present- Earache, Hearing Loss, Hoarseness, Nose Bleed, Oral Ulcers, Ringing in the Ears, Sinus Pain, Sore Throat, Visual Disturbances, Wears glasses/contact lenses and Yellow Eyes. Gastrointestinal Present- Bloating and Excessive gas. Not Present- Abdominal Pain, Bloody Stool, Change in Bowel Habits, Chronic diarrhea, Constipation, Difficulty Swallowing, Gets full quickly at meals, Hemorrhoids, Indigestion, Nausea, Rectal Pain and Vomiting. Endocrine Present- Excessive Hunger. Not Present- Cold Intolerance, Hair Changes, Heat  Intolerance, Hot flashes and New Diabetes. Hematology Present- Gland problems. Not Present- Easy Bruising, Excessive bleeding, HIV and Persistent Infections.  Vitals [name]; 04/20/2015 10:44 AM) 04/20/2015 10:44 AM Weight: 107.13 lb Height: 63in Body Surface Area: 1.47 m Body Mass Index: 18.98 kg/m BP: 118/80 (Sitting, Left Arm, Standard)     Physical Exam (Cosette Prindle A. Arville Postlewaite MD; 04/20/2015 11:58 AM)  General Mental Status-Alert. General Appearance-Consistent with stated age. Hydration-Well hydrated. Voice-Normal.  Integumentary Note: 4 CM X 3 CM INFLAMMED SEBACEOUS CYST NO DRAINAGE TENDER BUT NOT FLUCTUANT    Head and Neck Head-normocephalic, atraumatic with no lesions or palpable masses. Trachea-midline. Thyroid Gland Characteristics - normal size and consistency.  Eye Eyeball - Bilateral-Extraocular movements intact. Sclera/Conjunctiva - Bilateral-No scleral icterus.  Cardiovascular Cardiovascular examination reveals -normal heart sounds, regular rate and rhythm with no murmurs and normal pedal pulses bilaterally.  Neurologic Neurologic evaluation reveals -alert and oriented x 3 with no impairment of recent or remote memory. Mental Status-Normal.  Musculoskeletal Normal Exam - Left-Upper Extremity Strength Normal and Lower Extremity Strength Normal. Normal Exam - Right-Upper Extremity Strength Normal and Lower Extremity Strength Normal.    Assessment & Plan (Kaz Auld A. Tonny Isensee MD; 04/20/2015 11:11 AM)  INFLAMED SEBACEOUS CYST (706.2  L72.3) Impression: right shoulder recommend excision as outpatient. This looks chronically inflammed. Recommend outpatient surgery. Risk f bleeding infection and poor wound healing. recommend smoking cessation.  Current Plans Pt Education - Skin Conditions: discussed with patient and provided information. Pt Education - Overview of benign lesions of the skin: discussed with patient  and provided information. Started Clindamycin HCl 300MG , 1 (one) Capsule three times daily, #21, 04/20/2015, No Refill. Pt Education - C[employer] Free Text Education/Instructions: discussed  with patient and provided information.

## 2015-05-27 ENCOUNTER — Encounter (HOSPITAL_BASED_OUTPATIENT_CLINIC_OR_DEPARTMENT_OTHER): Payer: Self-pay | Admitting: Surgery

## 2015-05-27 NOTE — Anesthesia Postprocedure Evaluation (Signed)
Anesthesia Post Note  Patient: Gail Tucker  Procedure(s) Performed: Procedure(s) (LRB): EXCISION CYST ON RIGHT SHOULDER (Right)  Anesthesia type: General  Patient location: PACU  Post pain: Pain level controlled  Post assessment: Post-op Vital signs reviewed  Last Vitals: BP 139/86 mmHg  Pulse 67  Temp(Src) 37.1 C (Oral)  Resp 16  Ht 5\' 3"  (1.6 m)  Wt 106 lb (48.081 kg)  BMI 18.78 kg/m2  SpO2 100%  LMP 05/24/2015  Post vital signs: Reviewed  Level of consciousness: sedated  Complications: No apparent anesthesia complications

## 2015-06-01 ENCOUNTER — Encounter (HOSPITAL_COMMUNITY): Payer: Self-pay | Admitting: *Deleted

## 2015-06-01 ENCOUNTER — Emergency Department (HOSPITAL_COMMUNITY)
Admission: EM | Admit: 2015-06-01 | Discharge: 2015-06-01 | Disposition: A | Discharge status: Home / Self Care | Payer: Medicaid Other | Attending: Emergency Medicine | Admitting: Emergency Medicine

## 2015-06-01 DIAGNOSIS — Z872 Personal history of diseases of the skin and subcutaneous tissue: Secondary | ICD-10-CM | POA: Insufficient documentation

## 2015-06-01 DIAGNOSIS — Z8679 Personal history of other diseases of the circulatory system: Secondary | ICD-10-CM | POA: Diagnosis not present

## 2015-06-01 DIAGNOSIS — Z79899 Other long term (current) drug therapy: Secondary | ICD-10-CM | POA: Insufficient documentation

## 2015-06-01 DIAGNOSIS — Z720 Tobacco use: Secondary | ICD-10-CM | POA: Insufficient documentation

## 2015-06-01 DIAGNOSIS — K5900 Constipation, unspecified: Secondary | ICD-10-CM | POA: Insufficient documentation

## 2015-06-01 DIAGNOSIS — K5903 Drug induced constipation: Secondary | ICD-10-CM

## 2015-06-01 DIAGNOSIS — T402X5A Adverse effect of other opioids, initial encounter: Secondary | ICD-10-CM | POA: Diagnosis not present

## 2015-06-01 DIAGNOSIS — K219 Gastro-esophageal reflux disease without esophagitis: Secondary | ICD-10-CM | POA: Insufficient documentation

## 2015-06-01 MED ORDER — MAGNESIUM CITRATE PO SOLN
1.0000 | Freq: Once | ORAL | Status: AC
  Administered 2015-06-01: 1 via ORAL
  Filled 2015-06-01: qty 296

## 2015-06-01 MED ORDER — MAGNESIUM CITRATE PO SOLN
1.0000 | Freq: Once | ORAL | Status: DC
  Filled 2015-06-01: qty 296

## 2015-06-01 MED ORDER — POLYETHYLENE GLYCOL 3350 17 G PO PACK: PACK | ORAL | Status: DC

## 2015-06-01 NOTE — ED Provider Notes (Signed)
CSN: [id number]     Arrival date & time 06/01/15  0223 History   This chart was scribed for Gail Flemings, MD by Randa Evens, ED Scribe. This patient was seen in room WA10/WA10 and the patient's care was started at 2:33 AM.     Chief Complaint  Patient presents with  . Constipation   The history is provided by the patient. No language interpreter was used.   HPI Comments: Gail Tucker is a 39 y.o. female who presents to the Emergency Department complaining of constipation onset 6 days prior. Pt reports recently having a cyst excision on 05/26/2015 to her right upper back. She states that she was prescribed Norco that she thinks is contributing to her constipation. Pt states she has tried OTC stool softeners with no relief. Denies nausea or vomiting.    Past Medical History  Diagnosis Date  . Migraines   . GERD (gastroesophageal reflux disease)     occasional  . Wears partial dentures     lower  . Wears dentures     upper  . Cyst of skin 05/2015    right upper back   Past Surgical History  Procedure Laterality Date  . Dilation and curettage of uterus    . Tubal ligation  09/25/2005  . Multiple tooth extractions    . Dilation and evacuation  06/12/2001  . Lipoma excision Right 05/26/2015    Procedure: EXCISION CYST ON RIGHT SHOULDER;  Surgeon: Erroll Luna, MD;  Location: [facility];  Service: General;  Laterality: Right;   No family history on file. Social History  Substance Use Topics  . Smoking status: Current Every Day Smoker -- 10 years    Types: Cigarettes  . Smokeless tobacco: Never Used     Comment: 5 cig./day  . Alcohol Use: No   OB History    No data available     Review of Systems  Gastrointestinal: Positive for constipation. Negative for nausea and vomiting.  All other systems reviewed and are negative.     Allergies  Tetracyclines & related  Home Medications   Prior to Admission medications   Medication Sig Start Date End Date  Taking? Authorizing Provider  esomeprazole (NEXIUM) 20 MG capsule Take 20 mg by mouth daily at 12 noon.    Historical Provider, MD  HYDROcodone-acetaminophen (NORCO) 5-325 MG per tablet Take 1 tablet by mouth every 6 (six) hours as needed for moderate pain. 05/26/15   Erroll Luna, MD  megestrol (MEGACE) 40 MG/ML suspension Take by mouth 2 (two) times daily.    Historical Provider, MD  OxyCODONE HCl (ROXICODONE PO) Take 10 mg by mouth.    Historical Provider, MD   BP 102/60 mmHg  Pulse 87  Temp(Src) 98 F (36.7 C) (Oral)  Resp 16  SpO2 100%  LMP 05/24/2015   Physical Exam  Constitutional: She is oriented to person, place, and time. She appears well-developed and well-nourished.  HENT:  Head: Normocephalic and atraumatic.  Nose: Nose normal.  Mouth/Throat: Oropharynx is clear and moist.  Eyes: Conjunctivae and EOM are normal. Pupils are equal, round, and reactive to light.  Neck: Normal range of motion. Neck supple. No JVD present. No tracheal deviation present. No thyromegaly present.  Cardiovascular: Normal rate, regular rhythm, normal heart sounds and intact distal pulses.  Exam reveals no gallop and no friction rub.   No murmur heard. Pulmonary/Chest: Effort normal and breath sounds normal. No stridor. No respiratory distress. She has no wheezes. She has no  rales. She exhibits no tenderness.  Abdominal: Soft. Bowel sounds are normal. She exhibits no distension and no mass. There is no tenderness. There is no rebound and no guarding.  Genitourinary:  Moderate amount of hard stool in vault  Musculoskeletal: Normal range of motion. She exhibits no edema or tenderness.  Lymphadenopathy:    She has no cervical adenopathy.  Neurological: She is alert and oriented to person, place, and time. She displays normal reflexes. She exhibits normal muscle tone. Coordination normal.  Skin: Skin is warm and dry. No rash noted. No erythema. No pallor.  Psychiatric: She has a normal mood and  affect. Her behavior is normal. Judgment and thought content normal.  Nursing note and vitals reviewed.   ED Course  Procedures (including critical care time) DIAGNOSTIC STUDIES: Oxygen Saturation is 100% on RA, normal by my interpretation.    COORDINATION OF CARE: 2:38 AM-Discussed treatment plan with pt at bedside and pt agreed to plan.     Labs Review Labs Reviewed - No data to display  Imaging Review No results found. I have personally reviewed and evaluated these images and lab results as part of my medical decision-making.   EKG Interpretation None      MDM   Final diagnoses:  Constipation due to opioid therapy     I personally performed the services described in this documentation, which was scribed in my presence. The recorded information has been reviewed and is accurate.  39 yo female with c/o constipation, last BM Tues, has been on Norco after cyst removal.  Rectal exam with hard stool in vault.  Plan for enema, magnesium citrate here, miralax and colace home use.   3:55 AM Pt has had large bowel movement.  Will d/c home with miralax.  Gail Flemings, MD 06/01/15 [phone]

## 2015-06-01 NOTE — ED Notes (Signed)
Pt reports constipation, LBM was Tuesday.  Pt reports having a cyst removed Tuesday and has been taking pain meds.

## 2015-06-01 NOTE — Discharge Instructions (Signed)
Constipation °Constipation is when a person has fewer than three bowel movements a week, has difficulty having a bowel movement, or has stools that are dry, hard, or larger than normal. As people grow older, constipation is more common. If you try to fix constipation with medicines that make you have a bowel movement (laxatives), the problem may get worse. Long-term laxative use may cause the muscles of the colon to become weak. A low-fiber diet, not taking in enough fluids, and taking certain medicines may make constipation worse.  °CAUSES  °· Certain medicines, such as antidepressants, pain medicine, iron supplements, antacids, and water pills.   °· Certain diseases, such as diabetes, irritable bowel syndrome (IBS), thyroid disease, or depression.   °· Not drinking enough water.   °· Not eating enough fiber-rich foods.   °· Stress or travel.   °· Lack of physical activity or exercise.   °· Ignoring the urge to have a bowel movement.   °· Using laxatives too much.   °SIGNS AND SYMPTOMS  °· Having fewer than three bowel movements a week.   °· Straining to have a bowel movement.   °· Having stools that are hard, dry, or larger than normal.   °· Feeling full or bloated.   °· Pain in the lower abdomen.   °· Not feeling relief after having a bowel movement.   °DIAGNOSIS  °Your health care provider will take a medical history and perform a physical exam. Further testing may be done for severe constipation. Some tests may include: °· A barium enema X-ray to examine your rectum, colon, and, sometimes, your small intestine.   °· A sigmoidoscopy to examine your lower colon.   °· A colonoscopy to examine your entire colon. °TREATMENT  °Treatment will depend on the severity of your constipation and what is causing it. Some dietary treatments include drinking more fluids and eating more fiber-rich foods. Lifestyle treatments may include regular exercise. If these diet and lifestyle recommendations do not help, your health care  provider may recommend taking over-the-counter laxative medicines to help you have bowel movements. Prescription medicines may be prescribed if over-the-counter medicines do not work.  °HOME CARE INSTRUCTIONS  °· Eat foods that have a lot of fiber, such as fruits, vegetables, whole grains, and beans. °· Limit foods high in fat and processed sugars, such as french fries, hamburgers, cookies, candies, and soda.   °· A fiber supplement may be added to your diet if you cannot get enough fiber from foods.   °· Drink enough fluids to keep your urine clear or pale yellow.   °· Exercise regularly or as directed by your health care provider.   °· Go to the restroom when you have the urge to go. Do not hold it.   °· Only take over-the-counter or prescription medicines as directed by your health care provider. Do not take other medicines for constipation without talking to your health care provider first.   °SEEK IMMEDIATE MEDICAL CARE IF:  °· You have bright red blood in your stool.   °· Your constipation lasts for more than 4 days or gets worse.   °· You have abdominal or rectal pain.   °· You have thin, pencil-like stools.   °· You have unexplained weight loss. °MAKE SURE YOU:  °· Understand these instructions. °· Will watch your condition. °· Will get help right away if you are not doing well or get worse. °Document Released: 06/24/2004 Document Revised: 10/01/2013 Document Reviewed: 07/08/2013 °Exit[employer]® Patient Information ©2015 [employer]. This information is not intended to replace advice given to you by your health care provider. Make sure you discuss any questions   you have with your health care provider.  Preventing Constipation After Surgery Constipation is when a person has fewer than 3 bowel movements a week; has difficulty having a bowel movement; or has stools that are dry, hard, or larger than normal. Many things can make constipation likely after surgery. They include:  Medicines, especially numbing  medicines (anesthetics) and very strong pain medicines called narcotics.  Feeling stressed because of the surgery.  Eating different foods than normal.  Being less active. Symptoms of constipation include:  Having fewer than 3 bowel movements a week.  Straining to have a bowel movement.  Having hard, dry, or larger-than-normal stools.  Feeling full or bloated.  Having pain in the lower abdomen.  Not feeling relief after having a bowel movement. HOME CARE INSTRUCTIONS  Diet  Eat foods that have a lot of fiber. These include fruits, vegetables, whole grains, and beans. Limit foods high in fat and processed sugars. These include french fries, hamburgers, cookies, and candy.  Take a fiber supplement as directed. If you are not taking a fiber supplement and think that you are not getting enough fiber from foods, talk to your health care provider about adding a fiber supplement to your diet.  Drink clear fluids, especially water. Avoid drinking alcohol, caffeine, and soda. These can make constipation worse.  Drink enough fluids to keep your urine clear or pale yellow. Activity   After surgery, return to your normal activities slowly or when your health care provider says it is okay.  Start walking as soon as you can. Try to go a little farther each day.  Once your health care provider approves, do some sort of regular exercise. This helps prevent constipation. Bowel Movements  Go to the restroom when you have the urge to go. Do not hold it in.  Try drinking something hot to get a bowel movement started.  Keep track of how often you use the restroom. If you miss 2-3 bowel movements, talk to your health care provider about medicines that prevent constipation. Your health care provider may suggest a stool softener, laxative, or fiber supplement.  Only take over-the-counter or prescription medicines as directed by your health care provider.  Do not take other medicines without  talking to your health care provider first. If you become constipated and take a medicine to make you have a bowel movement, the problem may get worse. Other kinds of medicine can also make the problem worse. SEEK MEDICAL CARE IF:  You used stool softeners or laxatives and still have not had a bowel movement within 24-48 hours after using them.  You have not had a bowel movement in 3 days. SEEK IMMEDIATE MEDICAL CARE IF:   Your constipation lasts for more than 4 days or gets worse.  You have bright red blood in your stool.  You have abdominal or rectal pain.  You have very bad cramping.  You have thin, pencil-like stools.  You have unexplained weight loss.  You have a fever or persistent symptoms for more than 2-3 days.  You have a fever and your symptoms suddenly get worse. Document Released: 01/21/2013 Document Revised: 02/10/2014 Document Reviewed: 01/21/2013 [facility] Patient Information 2015 [place], [place]. This information is not intended to replace advice given to you by your health care provider. Make sure you discuss any questions you have with your health care provider.

## 2015-06-11 DIAGNOSIS — C801 Malignant (primary) neoplasm, unspecified: Secondary | ICD-10-CM

## 2015-06-11 HISTORY — DX: Malignant (primary) neoplasm, unspecified: C80.1

## 2015-06-29 NOTE — Progress Notes (Signed)
Histology and Location of Primary Skin Cancer: Right Shoulder  05/26/15 Diagnosis Skin , right shoulder - DERMATOFIBROSARCOMA PROTUBERANS. - DEEP AND LATERAL MARGINS BROADLY POSITIVE  Gail Tucker presented with an sore, inflamed cyst on her back, right shoulder region which had been present for "years."  Reported drainage of this area, "years ago".  Presently has a yellowish scab in the right shoulder region with a moist like appearance.  [name] reports that periodically this area drains.  Tenderness of the surrounding area.  SAFETY ISSUES:  Prior radiation? No  Pacemaker/ICD? No  Possible current pregnancy? No  Is the patient on methotrexate? No  Current Complaints / other details:    Allergies: Tetracyclines and related

## 2015-06-30 ENCOUNTER — Ambulatory Visit
Admission: RE | Admit: 2015-06-30 | Discharge: 2015-06-30 | Disposition: A | Discharge status: Home / Self Care | Payer: Medicaid Other | Source: Ambulatory Visit | Attending: Radiation Oncology | Admitting: Radiation Oncology

## 2015-06-30 ENCOUNTER — Encounter: Payer: Self-pay | Admitting: Radiation Oncology

## 2015-06-30 ENCOUNTER — Ambulatory Visit: Payer: Self-pay | Admitting: Surgery

## 2015-06-30 VITALS — BP 92/60 | HR 75 | Temp 99.0°F | Resp 16 | Ht 63.0 in | Wt 109.0 lb

## 2015-06-30 DIAGNOSIS — K219 Gastro-esophageal reflux disease without esophagitis: Secondary | ICD-10-CM | POA: Insufficient documentation

## 2015-06-30 DIAGNOSIS — F17210 Nicotine dependence, cigarettes, uncomplicated: Secondary | ICD-10-CM | POA: Insufficient documentation

## 2015-06-30 DIAGNOSIS — C44692 Other specified malignant neoplasm of skin of right upper limb, including shoulder: Secondary | ICD-10-CM | POA: Insufficient documentation

## 2015-06-30 DIAGNOSIS — Z8489 Family history of other specified conditions: Secondary | ICD-10-CM | POA: Insufficient documentation

## 2015-06-30 DIAGNOSIS — Z510 Encounter for antineoplastic radiation therapy: Secondary | ICD-10-CM | POA: Diagnosis not present

## 2015-06-30 HISTORY — DX: Malignant (primary) neoplasm, unspecified: C80.1

## 2015-06-30 NOTE — Progress Notes (Signed)
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    HISTORY OF PRESENT ILLNESS:  Gail Tucker is a 39 y.o. female who is seen today through the courtesy of Dr. 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  She tells me she is had a "cyst" along the right shoulder for the past 4 years.  She was seen by Dr. Brantley Tucker who felt that she had an epidermal inclusion cyst.  He performed an excision of a 3 x 5 cm mass on 05/26/2015.  He excised tumor down to the muscle.  She was found to have a dermatofibroma sarcoma protuberans with broadly positive deep and lateral margins.  This was reviewed by dermatopathology.  She states that she is had some drainage postoperatively along with some "soreness" but she is otherwise doing well.  PREVIOUS RADIATION THERAPY: No   PAST MEDICAL HISTORY:  has a past medical history of Migraines; GERD (gastroesophageal reflux disease); Wears partial dentures; Wears dentures; Cyst of skin (05/2015); and Cancer (05/26/15).     PAST SURGICAL HISTORY:  Past Surgical History  Procedure Laterality Date  . Dilation and curettage of uterus    . Tubal ligation  09/25/2005  . Multiple tooth extractions    . Dilation and evacuation  06/12/2001  . Lipoma excision Right 05/26/2015    Procedure: EXCISION CYST ON RIGHT SHOULDER;  Surgeon: Gail Luna, MD;  Location: [facility];  Service: General;  Laterality: Right;     FAMILY HISTORY: Her father died from AIDS it age 60.  Her mother is alive and well at 64.  SOCIAL HISTORY:  reports that she has been smoking Cigarettes.  She has smoked for the past 10 years. She has  never used smokeless tobacco. She reports that she does not drink alcohol or use illicit drugs.   Single, 3 children ages 26, 46, and 3.  She works as a Theme park manager.  ALLERGIES: Tetracyclines & related   MEDICATIONS:  Current Outpatient Prescriptions  Medication Sig Dispense Refill  . cephALEXin (KEFLEX) 500 MG capsule Take 500 mg by mouth 3 (three) times daily.    Marland Kitchen esomeprazole (NEXIUM) 20 MG capsule Take 20 mg by mouth daily at 12 noon.    . megestrol (MEGACE) 40 MG/ML suspension Take by mouth 2 (two) times daily.    . polyethylene glycol (MIRALAX / GLYCOLAX) packet Take one capful in 8 oz of fluid of your choice twice a day until having soft stools, then back down to once a day.  If not having soft stools with twice a day, increased to three times a day 100 each 0  . HYDROcodone-acetaminophen (NORCO) 5-325 MG per tablet Take 1 tablet by mouth every 6 (six) hours as needed for moderate pain. (Patient not taking: Reported on 06/01/2015) 30 tablet 0  . OxyCODONE HCl (ROXICODONE PO) Take 10 mg by mouth.     No current facility-administered medications for this encounter.     REVIEW OF SYSTEMS:  Pertinent items are noted in HPI.    PHYSICAL EXAM:  height is 5\' 3"  (1.6 m) and weight is 109 lb (49.442 kg). Her oral temperature is 99  F (37.2 C). Her blood pressure is 92/60 and her pulse is 75. Her respiration is 16.   Alert and oriented petite 39 year old after American female.  Nodes: There is no palpable cervical, supraclavicular or axillary lymphadenopathy.  On inspection of her posterior right shoulder there is a 2 x 2.5 cm granulating wound with an adjacent 3.5 cm incisional scar noted.  The wound appears to be healing well.     LABORATORY DATA:  Lab Results  Component Value Date   HGB 13.1 05/26/2015   No results found for: NA, K, CL, CO2 No results found for: ALT, AST, GGT, ALKPHOS, BILITOT    IMPRESSION: Dermatofibrosarcoma protuberans involving the medial aspect of the  posterior right shoulder.  I performed a literature review including "Up To Date".  These tumors can be locally aggressive.  The strongest predictor for local recurrence is surgical margins.  Wide local excision is recommended.  Peripheral margins of at least 2 cm are felt to be necessary.  A conservative resection and postoperative radiation therapy is recommended in situations where a wide local excision with resultant major cosmetic or functional deficits.  I recommend re-excision with a better peripheral margin to include the underlying muscular fascia and then postoperative radiation therapy.  We discussed the role and success of postoperative radiation therapy in this setting to prevent a local recurrence.  Approximately 6 weeks of radiation therapy is recommended.  From a technical standpoint, she would be treated with electrons.  We discussed the potential acute and late toxicities of radiation therapy.  I will share my thoughts with Dr. Brantley Tucker.   PLAN: As above.  I can see the patient back for a follow-up visit postoperatively.

## 2015-06-30 NOTE — Addendum Note (Signed)
Encounter addended by: Benn Moulder, RN on: 06/30/2015  6:31 PM<BR>     Documentation filed: Charges VN

## 2015-07-03 ENCOUNTER — Encounter: Payer: Self-pay | Admitting: *Deleted

## 2015-07-03 ENCOUNTER — Ambulatory Visit: Payer: Self-pay | Admitting: Surgery

## 2015-07-03 ENCOUNTER — Encounter (HOSPITAL_BASED_OUTPATIENT_CLINIC_OR_DEPARTMENT_OTHER): Payer: Self-pay | Admitting: *Deleted

## 2015-07-03 NOTE — H&P (Signed)
Gail Tucker 07/03/2015 9:01 AM Location: [facility] Patient #: [id number] DOB: October 02, 1976 Single / Language: Gail Tucker / Race: Black or African American Female History of Present Illness Gail Moores A. 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 She has seen radiation oncology. She neesde wide excision and post op radiation therapy. She is having less pain.  The patient is a 39 year old female   Allergies Elbert Ewings, CMA; 07/03/2015 9:01 AM) Tetracycline *CHEMICALS*  Medication History Elbert Ewings, CMA; 07/03/2015 9:02 AM) Keflex (500MG  Capsule, 1 (one) Capsule Oral three times daily, Taken starting 06/19/2015) Active. TraMADol HCl (50MG  Tablet, 1 (one) Tablet Oral four times daily, as needed, Taken starting 06/19/2015) Active. Percocet (5-325MG  Tablet, 1 (one) Tablet Oral every six hours, as needed, Taken starting 05/27/2015) Active. Clindamycin HCl (300MG  Capsule, 1 (one) Capsule Oral three times daily, Taken starting 04/20/2015) Active. Megestrol Acetate (40MG /ML Suspension, Oral) Active. Medications Reconciled    Vitals Elbert Ewings CMA; 07/03/2015 9:02 AM) 07/03/2015 9:02 AM Weight: 110 lb Height: 63in Body Surface Area: 1.49 m Body Mass Index: 19.49 kg/m Temp.: 98.56F(Temporal)  Pulse: 80 (Regular)  BP: 120/78 (Sitting, Left Arm, Standard)     Physical Exam (Thomas A. Cornett MD; 07/03/2015 9:28 AM)  General Mental Status-Alert. General Appearance-Consistent with stated age. Hydration-Well hydrated. Voice-Normal.  Integumentary Note: right shoulder inciosn dr and open area medially smaller this is over right trapezius muscle   Musculoskeletal Normal Exam - Left-Upper Extremity Strength Normal and Lower Extremity Strength Normal. Normal Exam - Right-Upper Extremity Strength Normal, Lower Extremity Weakness.    Assessment & Plan (Thomas A. 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  risk of bleeding, infection, more surgery, wound complication, nerve injury leading to disability, shoulder stiffness and other possible treatments, death, DVT, cardiovascular events  Current Plans   The anatomy and the physiology was discussed. The pathophysiology and natural history of the disease was discussed. Options were discussed and recommendations were made. Technique, risks, benefits, & alternatives were discussed. Risks such as stroke, heart attack, bleeding, indection, death, and other risks discussed. Questions answered. The patient agrees to proceed. The pathophysiology of skin & subcutaneous masses was discussed. Natural history risks without surgery were discussed. I recommended surgery to remove the mass. I explained the technique of removal with use of local anesthesia & possible need for more aggressive sedation/anesthesia for patient comfort.  Risks such as bleeding, infection, wound breakdown, heart attack, death, and other risks were discussed. I noted a good likelihood this will help address the problem. Possibility that this will not correct all symptoms was explained. Possibility of regrowth/recurrence of the mass was discussed. We will work to minimize complications. Questions were answered. The patient expresses understanding & wishes to proceed with surgery. Pt Education - Patient education: Skin cancer (non-melanoma) (The Basics): discussed with patient and provided information. POST-OPERATIVE STATE (Z98.89) Impression: WILL NEED RE EXCISION REFER TO RADIATION ONCOLOGY RETURN 2 WEEKS NEOSPORIN AND DRY DRESSING DAILY KEFLEX 500 MG MG PO TID TRAMADOL 50 MG PO Q 6 PRN

## 2015-07-03 NOTE — Progress Notes (Signed)
[place] Psychosocial Distress Screening Clinical Social Work  Clinical Social Work was referred by distress screening protocol.  The patient scored a 10 on the Psychosocial Distress Thermometer which indicates severe distress. Clinical Social Worker phoned pt to assess for distress and other psychosocial needs. CSW had to leave a hippa compliant message for pt to return call.   ONCBCN DISTRESS SCREENING 06/30/2015  Screening Type Initial Screening  Distress experienced in past week (1-10) 10  Practical problem type Housing;Work/school;Transportation  Emotional problem type Depression;Adjusting to appearance changes  Information Concerns Type Lack of info about diagnosis;Lack of info about treatment  Physical Problem type Pain;Sleep/insomnia;Loss of appetitie;Tingling hands/feet;Skin dry/itchy  Physician notified of physical symptoms Yes  Referral to clinical social work Yes     Clinical Social Worker follow up needed: No.  If yes, follow up plan:  Loren Racer, [place]  [facility] Phone: [phone] Fax: [phone]

## 2015-07-09 ENCOUNTER — Ambulatory Visit (HOSPITAL_BASED_OUTPATIENT_CLINIC_OR_DEPARTMENT_OTHER): Payer: Medicaid Other | Admitting: Anesthesiology

## 2015-07-09 ENCOUNTER — Encounter (HOSPITAL_BASED_OUTPATIENT_CLINIC_OR_DEPARTMENT_OTHER): Payer: Self-pay | Admitting: *Deleted

## 2015-07-09 ENCOUNTER — Encounter (HOSPITAL_BASED_OUTPATIENT_CLINIC_OR_DEPARTMENT_OTHER)
Admission: RE | Disposition: A | Discharge status: Home / Self Care | Payer: Self-pay | Source: Ambulatory Visit | Attending: Surgery

## 2015-07-09 ENCOUNTER — Ambulatory Visit (HOSPITAL_BASED_OUTPATIENT_CLINIC_OR_DEPARTMENT_OTHER)
Admission: RE | Admit: 2015-07-09 | Discharge: 2015-07-09 | Disposition: A | Discharge status: Home / Self Care | Payer: Medicaid Other | Source: Ambulatory Visit | Attending: Surgery | Admitting: Surgery

## 2015-07-09 DIAGNOSIS — Z87891 Personal history of nicotine dependence: Secondary | ICD-10-CM | POA: Diagnosis not present

## 2015-07-09 DIAGNOSIS — C7641 Malignant neoplasm of right upper limb: Secondary | ICD-10-CM | POA: Diagnosis present

## 2015-07-09 SURGERY — EXCISION MASS UPPER EXTREMITIES
Anesthesia: General | Site: Shoulder | Laterality: Right

## 2015-07-09 MED ORDER — MIDAZOLAM HCL 2 MG/2ML IJ SOLN
INTRAMUSCULAR | Status: AC
  Filled 2015-07-09: qty 4

## 2015-07-09 MED ORDER — CEFAZOLIN SODIUM-DEXTROSE 2-3 GM-% IV SOLR
INTRAVENOUS | Status: AC
Start: 2015-07-09 — End: 2015-07-09
  Filled 2015-07-09: qty 50

## 2015-07-09 MED ORDER — DEXTROSE 5 % IV SOLN: 3.0000 g | INTRAVENOUS | Status: DC

## 2015-07-09 MED ORDER — CHLORHEXIDINE GLUCONATE 4 % EX LIQD: 1.0000 "application " | Freq: Once | CUTANEOUS | Status: DC

## 2015-07-09 MED ORDER — BUPIVACAINE-EPINEPHRINE (PF) 0.25% -1:200000 IJ SOLN
INTRAMUSCULAR | Status: AC
  Filled 2015-07-09: qty 30

## 2015-07-09 MED ORDER — MIDAZOLAM HCL 2 MG/2ML IJ SOLN
1.0000 mg | INTRAMUSCULAR | Status: DC | PRN
  Administered 2015-07-09: 2 mg via INTRAVENOUS

## 2015-07-09 MED ORDER — PROPOFOL 10 MG/ML IV BOLUS
INTRAVENOUS | Status: DC | PRN
  Administered 2015-07-09: 150 mg via INTRAVENOUS

## 2015-07-09 MED ORDER — LIDOCAINE HCL (CARDIAC) 20 MG/ML IV SOLN
INTRAVENOUS | Status: AC
Start: 2015-07-09 — End: 2015-07-09
  Filled 2015-07-09: qty 5

## 2015-07-09 MED ORDER — PROMETHAZINE HCL 25 MG/ML IJ SOLN: 6.2500 mg | INTRAMUSCULAR | Status: DC | PRN

## 2015-07-09 MED ORDER — FENTANYL CITRATE (PF) 100 MCG/2ML IJ SOLN
INTRAMUSCULAR | Status: AC
  Filled 2015-07-09: qty 4

## 2015-07-09 MED ORDER — DEXTROSE 5 % IV SOLN
3.0000 g | INTRAVENOUS | Status: AC
  Administered 2015-07-09: 2 g via INTRAVENOUS

## 2015-07-09 MED ORDER — FENTANYL CITRATE (PF) 100 MCG/2ML IJ SOLN
50.0000 ug | INTRAMUSCULAR | Status: DC | PRN
  Administered 2015-07-09: 100 ug via INTRAVENOUS
  Administered 2015-07-09: 25 ug via INTRAVENOUS

## 2015-07-09 MED ORDER — GLYCOPYRROLATE 0.2 MG/ML IJ SOLN: 0.2000 mg | Freq: Once | INTRAMUSCULAR | Status: DC | PRN

## 2015-07-09 MED ORDER — ONDANSETRON HCL 4 MG/2ML IJ SOLN
INTRAMUSCULAR | Status: AC
  Filled 2015-07-09: qty 2

## 2015-07-09 MED ORDER — ONDANSETRON HCL 4 MG/2ML IJ SOLN
INTRAMUSCULAR | Status: DC | PRN
  Administered 2015-07-09: 4 mg via INTRAVENOUS

## 2015-07-09 MED ORDER — LIDOCAINE HCL (CARDIAC) 20 MG/ML IV SOLN
INTRAVENOUS | Status: DC | PRN
  Administered 2015-07-09: 30 mg via INTRAVENOUS

## 2015-07-09 MED ORDER — BACITRACIN-NEOMYCIN-POLYMYXIN 400-5-5000 EX OINT
TOPICAL_OINTMENT | CUTANEOUS | Status: AC
  Filled 2015-07-09: qty 1

## 2015-07-09 MED ORDER — OXYCODONE-ACETAMINOPHEN 5-325 MG PO TABS: 1.0000 | ORAL_TABLET | ORAL | Status: DC | PRN

## 2015-07-09 MED ORDER — SUCCINYLCHOLINE CHLORIDE 20 MG/ML IJ SOLN
INTRAMUSCULAR | Status: DC | PRN
  Administered 2015-07-09: 100 mg via INTRAVENOUS

## 2015-07-09 MED ORDER — DEXAMETHASONE SODIUM PHOSPHATE 10 MG/ML IJ SOLN
INTRAMUSCULAR | Status: AC
  Filled 2015-07-09: qty 1

## 2015-07-09 MED ORDER — DEXAMETHASONE SODIUM PHOSPHATE 4 MG/ML IJ SOLN
INTRAMUSCULAR | Status: DC | PRN
  Administered 2015-07-09: 8 mg via INTRAVENOUS

## 2015-07-09 MED ORDER — FENTANYL CITRATE (PF) 100 MCG/2ML IJ SOLN: 25.0000 ug | INTRAMUSCULAR | Status: DC | PRN

## 2015-07-09 MED ORDER — SCOPOLAMINE 1 MG/3DAYS TD PT72: 1.0000 | MEDICATED_PATCH | Freq: Once | TRANSDERMAL | Status: DC | PRN

## 2015-07-09 MED ORDER — LACTATED RINGERS IV SOLN
INTRAVENOUS | Status: DC
  Administered 2015-07-09 (×2): via INTRAVENOUS

## 2015-07-09 MED ORDER — BUPIVACAINE-EPINEPHRINE 0.25% -1:200000 IJ SOLN
INTRAMUSCULAR | Status: DC | PRN
  Administered 2015-07-09: 20 mL

## 2015-07-09 SURGICAL SUPPLY — 47 items
APL SKNCLS STERI-STRIP NONHPOA (GAUZE/BANDAGES/DRESSINGS)
BENZOIN TINCTURE PRP APPL 2/3 (GAUZE/BANDAGES/DRESSINGS) IMPLANT
BLADE SURG 10 STRL SS (BLADE) IMPLANT
BLADE SURG 15 STRL LF DISP TIS (BLADE) ×1 IMPLANT
BLADE SURG 15 STRL SS (BLADE) ×3
CANISTER SUCT 1200ML W/VALVE (MISCELLANEOUS) IMPLANT
CHLORAPREP W/TINT 26ML (MISCELLANEOUS) ×3 IMPLANT
CLOSURE WOUND 1/2 X4 (GAUZE/BANDAGES/DRESSINGS)
COVER BACK TABLE 60X90IN (DRAPES) ×3 IMPLANT
COVER MAYO STAND STRL (DRAPES) ×3 IMPLANT
DECANTER SPIKE VIAL GLASS SM (MISCELLANEOUS) IMPLANT
DRAPE LAPAROTOMY 100X72 PEDS (DRAPES) ×3 IMPLANT
DRAPE UTILITY XL STRL (DRAPES) ×3 IMPLANT
ELECT COATED BLADE 2.86 ST (ELECTRODE) ×3 IMPLANT
ELECT REM PT RETURN 9FT ADLT (ELECTROSURGICAL) ×3
ELECTRODE REM PT RTRN 9FT ADLT (ELECTROSURGICAL) ×1 IMPLANT
GLOVE BIOGEL PI IND STRL 7.5 (GLOVE) IMPLANT
GLOVE BIOGEL PI IND STRL 8 (GLOVE) ×1 IMPLANT
GLOVE BIOGEL PI INDICATOR 7.5 (GLOVE) ×2
GLOVE BIOGEL PI INDICATOR 8 (GLOVE) ×2
GLOVE ECLIPSE 8.0 STRL XLNG CF (GLOVE) ×3 IMPLANT
GLOVE SURG SS PI 7.5 STRL IVOR (GLOVE) ×2 IMPLANT
GOWN STRL REUS W/ TWL LRG LVL3 (GOWN DISPOSABLE) ×2 IMPLANT
GOWN STRL REUS W/TWL LRG LVL3 (GOWN DISPOSABLE) ×6
LIQUID BAND (GAUZE/BANDAGES/DRESSINGS) ×2 IMPLANT
NDL HYPO 25X1 1.5 SAFETY (NEEDLE) ×1 IMPLANT
NEEDLE HYPO 25X1 1.5 SAFETY (NEEDLE) ×3 IMPLANT
NS IRRIG 1000ML POUR BTL (IV SOLUTION) IMPLANT
PACK BASIN DAY SURGERY FS (CUSTOM PROCEDURE TRAY) ×3 IMPLANT
PENCIL BUTTON HOLSTER BLD 10FT (ELECTRODE) ×3 IMPLANT
SLEEVE SCD COMPRESS KNEE MED (MISCELLANEOUS) ×3 IMPLANT
SPONGE GAUZE 4X4 12PLY STER LF (GAUZE/BANDAGES/DRESSINGS) ×2 IMPLANT
SPONGE LAP 4X18 X RAY DECT (DISPOSABLE) IMPLANT
STAPLER VISISTAT 35W (STAPLE) IMPLANT
STRIP CLOSURE SKIN 1/2X4 (GAUZE/BANDAGES/DRESSINGS) IMPLANT
SUT ETHILON 2 0 FS 18 (SUTURE) ×6 IMPLANT
SUT MON AB 4-0 PC3 18 (SUTURE) ×3 IMPLANT
SUT VIC AB 0 CT1 27 (SUTURE) ×6
SUT VIC AB 0 CT1 27XBRD ANBCTR (SUTURE) IMPLANT
SUT VICRYL 3-0 CR8 SH (SUTURE) ×3 IMPLANT
SUT VICRYL AB 3 0 TIES (SUTURE) IMPLANT
SYR CONTROL 10ML LL (SYRINGE) ×3 IMPLANT
TOWEL OR 17X24 6PK STRL BLUE (TOWEL DISPOSABLE) ×6 IMPLANT
TOWEL OR NON WOVEN STRL DISP B (DISPOSABLE) ×3 IMPLANT
TUBE CONNECTING 20'X1/4 (TUBING)
TUBE CONNECTING 20X1/4 (TUBING) IMPLANT
YANKAUER SUCT BULB TIP NO VENT (SUCTIONS) IMPLANT

## 2015-07-09 NOTE — Transfer of Care (Signed)
Immediate Anesthesia Transfer of Care Note  Patient: Gail Tucker  Procedure(s) Performed: Procedure(s): WIDE EXCISION OF RIGHT SHOULDER SARCOMA (Right)  Patient Location: PACU  Anesthesia Type:General  Level of Consciousness: awake  Airway & Oxygen Therapy: Patient Spontanous Breathing and Patient connected to face mask oxygen  Post-op Assessment: Report given to RN and Post -op Vital signs reviewed and stable  Post vital signs: Reviewed and stable  Last Vitals:  Filed Vitals:   07/09/15 1224  BP: 116/80  Pulse: 87  Temp: 37 C  Resp: 18    Complications: No apparent anesthesia complications

## 2015-07-09 NOTE — Anesthesia Preprocedure Evaluation (Addendum)
Anesthesia Evaluation  Patient identified by MRN, date of birth, ID band  Reviewed: Allergy & Precautions, NPO status , Patient's Chart, lab work & pertinent test results  History of Anesthesia Complications (+) PONV  Airway Mallampati: II  TM Distance: >3 FB Neck ROM: Full    Dental   Pulmonary former smoker,    breath sounds clear to auscultation       Cardiovascular negative cardio ROS   Rhythm:Regular Rate:Normal     Neuro/Psych    GI/Hepatic Neg liver ROS, GERD  ,  Endo/Other  negative endocrine ROS  Renal/GU negative Renal ROS     Musculoskeletal   Abdominal   Peds  Hematology   Anesthesia Other Findings   Reproductive/Obstetrics                            Anesthesia Physical Anesthesia Plan  ASA: II  Anesthesia Plan: General   Post-op Pain Management:    Induction: Intravenous  Airway Management Planned: LMA  Additional Equipment:   Intra-op Plan:   Post-operative Plan: Extubation in OR  Informed Consent: I have reviewed the patients History and Physical, chart, labs and discussed the procedure including the risks, benefits and alternatives for the proposed anesthesia with the patient or authorized representative who has indicated his/her understanding and acceptance.   Dental advisory given  Plan Discussed with: CRNA and Anesthesiologist  Anesthesia Plan Comments:         Anesthesia Quick Evaluation

## 2015-07-09 NOTE — H&P (View-Only) (Signed)
Gail Tucker 07/03/2015 9:01 AM Location: [facility] Patient #: [id number] DOB: 03/10/1976 Single / Language: [name] / Race: Black or African American Female History of Present Illness 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 She has seen radiation oncology. She neesde wide excision and post op radiation therapy. She is having less pain.  The patient is a 39 year old female   Allergies Elbert Ewings, CMA; 07/03/2015 9:01 AM) Tetracycline *CHEMICALS*  Medication History Elbert Ewings, CMA; 07/03/2015 9:02 AM) Keflex (500MG  Capsule, 1 (one) Capsule Oral three times daily, Taken starting 06/19/2015) Active. TraMADol HCl (50MG  Tablet, 1 (one) Tablet Oral four times daily, as needed, Taken starting 06/19/2015) Active. Percocet (5-325MG  Tablet, 1 (one) Tablet Oral every six hours, as needed, Taken starting 05/27/2015) Active. Clindamycin HCl (300MG  Capsule, 1 (one) Capsule Oral three times daily, Taken starting 04/20/2015) Active. Megestrol Acetate (40MG /ML Suspension, Oral) Active. Medications Reconciled    Vitals Elbert Ewings CMA; 07/03/2015 9:02 AM) 07/03/2015 9:02 AM Weight: 110 lb Height: 63in Body Surface Area: 1.49 m Body Mass Index: 19.49 kg/m Temp.: 98.62F(Temporal)  Pulse: 80 (Regular)  BP: 120/78 (Sitting, Left Arm, Standard)     Physical Exam (Thomas A. Cornett MD; 07/03/2015 9:28 AM)  General Mental Status-Alert. General Appearance-Consistent with stated age. Hydration-Well hydrated. Voice-Normal.  Integumentary Note: right shoulder inciosn dr and open area medially smaller this is over right trapezius muscle   Musculoskeletal Normal Exam - Left-Upper Extremity Strength Normal and Lower Extremity Strength Normal. Normal Exam - Right-Upper Extremity Strength Normal, Lower Extremity Weakness.    Assessment & Plan (Thomas A. Cornett  MD; 07/03/2015 9:33 AM)  DERMATOFIBROSARCOMA PROTUBERA OF SHOULDER (B70.488) Impression: will need wide excision in outpatient surgery.  risk of bleeding, infection, more surgery, wound complication, nerve injury leading to disability, shoulder stiffness and other possible treatments, death, DVT, cardiovascular events  Current Plans   The anatomy and the physiology was discussed. The pathophysiology and natural history of the disease was discussed. Options were discussed and recommendations were made. Technique, risks, benefits, & alternatives were discussed. Risks such as stroke, heart attack, bleeding, indection, death, and other risks discussed. Questions answered. The patient agrees to proceed. The pathophysiology of skin & subcutaneous masses was discussed. Natural history risks without surgery were discussed. I recommended surgery to remove the mass. I explained the technique of removal with use of local anesthesia & possible need for more aggressive sedation/anesthesia for patient comfort.  Risks such as bleeding, infection, wound breakdown, heart attack, death, and other risks were discussed. I noted a good likelihood this will help address the problem. Possibility that this will not correct all symptoms was explained. Possibility of regrowth/recurrence of the mass was discussed. We will work to minimize complications. Questions were answered. The patient expresses understanding & wishes to proceed with surgery. Pt Education - Patient education: Skin cancer (non-melanoma) (The Basics): discussed with patient and provided information. POST-OPERATIVE STATE (Z98.89) Impression: WILL NEED RE EXCISION REFER TO RADIATION ONCOLOGY RETURN 2 WEEKS NEOSPORIN AND DRY DRESSING DAILY KEFLEX 500 MG MG PO TID TRAMADOL 50 MG PO Q 6 PRN

## 2015-07-09 NOTE — Anesthesia Procedure Notes (Signed)
Procedure Name: Intubation Date/Time: 07/09/2015 1:41 PM Performed by: Melynda Ripple D Pre-anesthesia Checklist: Patient identified, Emergency Drugs available, Suction available and Patient being monitored Patient Re-evaluated:Patient Re-evaluated prior to inductionOxygen Delivery Method: Circle System Utilized Preoxygenation: Pre-oxygenation with 100% oxygen Intubation Type: IV induction Ventilation: Mask ventilation without difficulty Laryngoscope Size: Mac and 3 Grade View: Grade I Tube type: Oral Tube size: 7.0 mm Number of attempts: 1 Airway Equipment and Method: Stylet and Oral airway Placement Confirmation: ETT inserted through vocal cords under direct vision,  positive ETCO2 and breath sounds checked- equal and bilateral Secured at: 22 cm Tube secured with: Tape Dental Injury: Teeth and Oropharynx as per pre-operative assessment

## 2015-07-09 NOTE — Brief Op Note (Signed)
07/09/2015  2:38 PM  PATIENT:  Gail Tucker  39 y.o. female  PRE-OPERATIVE DIAGNOSIS:  Right Shoulder Sarcoma  POST-OPERATIVE DIAGNOSIS:  Right Shoulder Sarcoma  PROCEDURE:  Procedure(s): WIDE EXCISION OF RIGHT SHOULDER SARCOMA (Right)  SURGEON:  Surgeon(s) and Role:    * Erroll Luna, MD - Primary    ASSISTANTS: none   ANESTHESIA:   general  EBL:  Total I/O In: 1000 [I.V.:1000] Out: -   BLOOD ADMINISTERED:none  DRAINS: none   LOCAL MEDICATIONS USED:  BUPIVICAINE   SPECIMEN:  Source of Specimen:  right shoulder  DISPOSITION OF SPECIMEN:  PATHOLOGY  COUNTS:  YES  TOURNIQUET:  * No tourniquets in log *  DICTATION: .Other Dictation: Dictation Number [id number]  PLAN OF CARE: Discharge to home after PACU  PATIENT DISPOSITION:  PACU - hemodynamically stable.   Delay start of Pharmacological VTE agent (>24hrs) due to surgical blood loss or risk of bleeding: not applicable

## 2015-07-09 NOTE — Discharge Instructions (Signed)
Apply triple antibiotic ointment to incision daily  Ok to shower in 48 hours  Apply clean gauze to incision daily  Return to office in 2 weeks  No lifting pushing pulling for 2 weeks     Post Anesthesia Home Care Instructions  Activity: Get plenty of rest for the remainder of the day. A responsible adult should stay with you for 24 hours following the procedure.  For the next 24 hours, DO NOT: -Drive a car -Paediatric nurse -Drink alcoholic beverages -Take any medication unless instructed by your physician -Make any legal decisions or sign important papers.  Meals: Start with liquid foods such as gelatin or soup. Progress to regular foods as tolerated. Avoid greasy, spicy, heavy foods. If nausea and/or vomiting occur, drink only clear liquids until the nausea and/or vomiting subsides. Call your physician if vomiting continues.  Special Instructions/Symptoms: Your throat may feel dry or sore from the anesthesia or the breathing tube placed in your throat during surgery. If this causes discomfort, gargle with warm salt water. The discomfort should disappear within 24 hours.  If you had a scopolamine patch placed behind your ear for the management of post- operative nausea and/or vomiting:  1. The medication in the patch is effective for 72 hours, after which it should be removed.  Wrap patch in a tissue and discard in the trash. Wash hands thoroughly with soap and water. 2. You may remove the patch earlier than 72 hours if you experience unpleasant side effects which may include dry mouth, dizziness or visual disturbances. 3. Avoid touching the patch. Wash your hands with soap and water after contact with the patch.

## 2015-07-09 NOTE — Interval H&P Note (Signed)
History and Physical Interval Note:  07/09/2015 1:02 PM  Gail Tucker  has presented today for surgery, with the diagnosis of Right Shoulder Sarcoma  The various methods of treatment have been discussed with the patient and family. After consideration of risks, benefits and other options for treatment, the patient has consented to  Procedure(s): WIDE EXCISION OF RIGHT SHOULDER SARCOMA (Right) as a surgical intervention .  The patient's history has been reviewed, patient examined, no change in status, stable for surgery.  I have reviewed the patient's chart and labs.  Questions were answered to the patient's satisfaction.     CORNETT,THOMAS A.

## 2015-07-10 NOTE — Op Note (Signed)
[name], Gail Tucker NO.:  [id number]  MEDICAL RECORD NO.:  [id number]  LOCATION:                               FACILITY:  [facility]  PHYSICIAN:  [name], M.D.DATE OF BIRTH:  [birth year]  DATE OF PROCEDURE:  07/09/2015 DATE OF DISCHARGE:  07/09/2015                              OPERATIVE REPORT   PREOPERATIVE DIAGNOSIS:  Dermatofibrosarcoma protuberans, posterior right shoulder.  POSTOPERATIVE DIAGNOSIS:  Dermatofibrosarcoma protuberans, posterior right shoulder.  PROCEDURE:  Wide excision of right shoulder dermatofibrosarcoma with intermediate closure measuring 10 cm.  SURGEON:  [name], M.D.  ANESTHESIA:  General endotracheal anesthesia with 0.25% Sensorcaine local with epinephrine.  EBL:  Minimal.  SPECIMENS:  Previous area where dermatofibrosarcoma excision was with a 2-cm peripheral margin including the fascia of the underlying musculature.  DRAINS:  None.  SPECIMEN:  Please see above.  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 She underwent excision and was found to have dermatofibrosarcoma protuberans with positive lateral margin and deep margin.  She presents today for wider excision and subsequent postoperative radiation therapy. Risks, benefits, and alternatives were discussed with the patient.  The pathophysiology of dermatofibrosarcoma protuberans was discussed with the patient as well as long-term expectations.  Risks of bleeding, infection, stiffness, deformity, nerve injury, blood vessel injury, the need for further surgery, potential other therapies, were discussed with the patient.  The wound complications were discussed with the patient. Scar formation was discussed with the patient.  After the above discussion, she understood and agreed to proceed.  She has seen Radiation Oncology in consultation.  DESCRIPTION  OF PROCEDURE:  The patient was met in the holding area.  The operative site was marked and encircled.  She was taken back to the operating room after answering questions and placed initially supine where general anesthesia was initiated.  She was then placed in a beanbag and rolled on her left side and was appropriately padded.  The upper right shoulder region was prepped and draped in sterile fashion with the previous excision was done.  Time-out was done.  We injected 0.25% Sensorcaine circumferentially around the area.  A wide generous excision was done with 2-cm peripheral margins.  We took the dissection down and took the fascia of the trapezius muscle with the specimen.  We did not go deep to the muscle though.  There were no signs of infection. The entire specimen was oriented to pass off the field.  The wound was found to be hemostatic.  I mobilized the superior skin flap approximately 4 cm in all directions to facilitate closure.  A two-layer intermediate closure was used using 0 Vicryl for a deep layer.  A 2-0 nylon was used to close the skin. Triple antibiotic ointment was applied as well as dry dressings.  The patient was then placed supine and extubated.  All final counts were found to be correct.  She was taken to recovery in satisfactory condition.  All counts were correct x2.     Thomas A. Cornett, M.D.     TAC/MEDQ  D:  07/09/2015  T:  07/10/2015  Job:  [id number]

## 2015-07-10 NOTE — Anesthesia Postprocedure Evaluation (Signed)
Anesthesia Post Note  Patient: Gail Tucker  Procedure(s) Performed: Procedure(s) (LRB): WIDE EXCISION OF RIGHT SHOULDER SARCOMA (Right)  Anesthesia type: general  Patient location: PACU  Post pain: Pain level controlled  Post assessment: Patient's Cardiovascular Status Stable  Last Vitals:  Filed Vitals:   07/09/15 1550  BP: 125/92  Pulse: 89  Temp: 36.7 C  Resp: 14    Post vital signs: Reviewed and stable  Level of consciousness: sedated  Complications: No apparent anesthesia complications

## 2015-07-13 ENCOUNTER — Telehealth: Payer: Self-pay | Admitting: *Deleted

## 2015-07-13 NOTE — Telephone Encounter (Signed)
CALLED PATIENT TO INFORM OF [facility] APPT. FOR 07-28-15, SPOKE WITH PATIENT AND SHE IS AWARE OF THIS APPT.

## 2015-07-27 NOTE — Progress Notes (Signed)
07/09/2015 Wide Excision of Right Shoulder Dermatofibrosarcoma.

## 2015-07-28 ENCOUNTER — Ambulatory Visit
Admission: RE | Admit: 2015-07-28 | Discharge: 2015-07-28 | Disposition: A | Discharge status: Home / Self Care | Payer: Medicaid Other | Source: Ambulatory Visit | Attending: Radiation Oncology | Admitting: Radiation Oncology

## 2015-07-28 ENCOUNTER — Encounter: Payer: Self-pay | Admitting: Radiation Oncology

## 2015-07-28 VITALS — BP 119/77 | HR 101 | Temp 99.5°F | Resp 16 | Ht 63.0 in | Wt 106.6 lb

## 2015-07-28 DIAGNOSIS — C44692 Other specified malignant neoplasm of skin of right upper limb, including shoulder: Secondary | ICD-10-CM

## 2015-07-28 DIAGNOSIS — Z510 Encounter for antineoplastic radiation therapy: Secondary | ICD-10-CM | POA: Diagnosis not present

## 2015-07-28 NOTE — Progress Notes (Signed)
CC: Dr. Erroll Luna, Dr. Ricke Hey   Follow-up note:  Diagnosis: Dermatofibrosarcoma protuberance, posterior right shoulder  History: I saw the patient consultation on 06/30/2015. She had a "cyst" along the right shoulder for the past 4 years. She was seen by Dr. Brantley Stage who felt that she had an epidermal inclusion cyst. He performed an excision of a 3 x 5 cm mass on 05/26/2015. He excised tumor down to the muscle. She was found to have  dermatofibrosarcoma protuberans with broadly positive deep and lateral margins. This was reviewed by dermatopathology.I recommended reexcision which also had been planned by Dr. Brantley Stage.  She was taken back to surgery on 07/09/2015.  There was multifocal microscopic deep margin involvement but lateral margins were free of tumor.  There was approximately 1 cm of residual tumor.  Dr. Brantley Stage went down into the fascia of the trapezius muscle but he did resect muscle because of possible functional consequences.  She is doing well postoperatively although she is somewhat uncomfortable as expected.  Physical examination: Alert and oriented. Filed Vitals:   07/28/15 1248  BP: 119/77  Pulse: 101  Temp: 99.5 F (37.5 C)  Resp: 16   On inspection the right shoulder there is an 11 cm wound with sutures intact.  Her wound is healing well.  Impression: Dermatofibrosarcoma protuberans involving the posterior right shoulder.  Her margins are satisfactory.  She will require approximately 6 weeks of postoperative radiation therapy particularly in view of her microscopic deep margin.  We discussed the potential acute and late toxicities of radiation therapy.  Consent is signed today.  I believe she'll see Dr. Brantley Stage this Friday, and she can return next week for his simulation.  She'll be treated with electrons.  Plan: As above.  30 minutes was spent face-to-face the patient, primarily counseling patient and coordinating her care.

## 2015-08-03 ENCOUNTER — Ambulatory Visit
Admission: RE | Admit: 2015-08-03 | Discharge: 2015-08-03 | Disposition: A | Discharge status: Home / Self Care | Payer: Medicaid Other | Source: Ambulatory Visit | Attending: Radiation Oncology | Admitting: Radiation Oncology

## 2015-08-03 DIAGNOSIS — C44692 Other specified malignant neoplasm of skin of right upper limb, including shoulder: Secondary | ICD-10-CM

## 2015-08-03 DIAGNOSIS — Z510 Encounter for antineoplastic radiation therapy: Secondary | ICD-10-CM | POA: Diagnosis not present

## 2015-08-03 NOTE — Progress Notes (Signed)
Complex simulation/treatment planning note: The patient was taken to the simulator.  She was placed prone on a chest condition and head immobilizer.  I marked her block borders with radiopaque wires along her posterior right shoulder.  The outer wire/blockage will receive 5000 cGy in 25 sessions with a reduced field to the inside wire representing the reduced field block borders for an additional 1000 cGy in 5 sessions.  I anticipate using 9 MEV electrons, with 1.0 cm custom bolus applied daily to maximize the dose to the skin surface.  An isodose plan is requested.

## 2015-08-04 ENCOUNTER — Encounter: Payer: Self-pay | Admitting: Radiation Oncology

## 2015-08-04 DIAGNOSIS — Z510 Encounter for antineoplastic radiation therapy: Secondary | ICD-10-CM | POA: Diagnosis not present

## 2015-08-04 NOTE — Progress Notes (Signed)
Electron beam simulation/treatment planning note: The patient completed electron beam treatment planning for treatment to her posterior right shoulder.  She was set up en face.  One custom block is constructed to conform the field.  An isodose plan was reviewed with a Monte Carlo calculation.  I am prescribing 5000 cGy in 25 sessions utilizing 12 MEV electrons.  1.0 cm bolus is applied daily.

## 2015-08-09 ENCOUNTER — Ambulatory Visit: Admission: RE | Admit: 2015-08-09 | Payer: Medicaid Other | Source: Ambulatory Visit | Admitting: Radiation Oncology

## 2015-08-09 ENCOUNTER — Ambulatory Visit: Admission: RE | Admit: 2015-08-09 | Payer: Medicaid Other | Source: Ambulatory Visit

## 2015-08-10 ENCOUNTER — Ambulatory Visit: Payer: Medicaid Other

## 2015-08-10 ENCOUNTER — Ambulatory Visit: Payer: Medicaid Other | Admitting: Radiation Oncology

## 2015-08-10 ENCOUNTER — Ambulatory Visit: Admission: RE | Admit: 2015-08-10 | Payer: Medicaid Other | Source: Ambulatory Visit

## 2015-08-10 ENCOUNTER — Ambulatory Visit
Admission: RE | Admit: 2015-08-10 | Discharge: 2015-08-10 | Disposition: A | Discharge status: Home / Self Care | Payer: Medicaid Other | Source: Ambulatory Visit | Attending: Radiation Oncology | Admitting: Radiation Oncology

## 2015-08-10 ENCOUNTER — Ambulatory Visit: Admission: RE | Admit: 2015-08-10 | Payer: Medicaid Other | Source: Ambulatory Visit | Admitting: Radiation Oncology

## 2015-08-10 DIAGNOSIS — C44692 Other specified malignant neoplasm of skin of right upper limb, including shoulder: Secondary | ICD-10-CM

## 2015-08-10 NOTE — Progress Notes (Signed)
Weekly Management Note:  Site: Posterior right shoulder Current Dose:  200  cGy Projected Dose: 6000  cGy  Narrative: The patient is seen today for routine under treatment assessment. CBCT/MVCT images/port films were reviewed. The chart was reviewed.   Gail Tucker is to begin her radiation therapy today to her posterior right shoulder.  She is without new complaints today.  Physical Examination: There were no vitals filed for this visit..  Weight:  .  On examination there is a superficial scab approximately 1 cm in diameter along her mid posterior right shoulder scar.  Impression: I was just notified that her treatment machine is down for today, so we will begin her radiation therapy tomorrow instead.  Plan: Begin radiation therapy tomorrow.

## 2015-08-11 ENCOUNTER — Ambulatory Visit: Admission: RE | Admit: 2015-08-11 | Payer: Medicaid Other | Source: Ambulatory Visit | Admitting: Radiation Oncology

## 2015-08-11 ENCOUNTER — Ambulatory Visit
Admission: RE | Admit: 2015-08-11 | Discharge: 2015-08-11 | Disposition: A | Discharge status: Home / Self Care | Payer: Medicaid Other | Source: Ambulatory Visit | Attending: Radiation Oncology | Admitting: Radiation Oncology

## 2015-08-12 ENCOUNTER — Ambulatory Visit
Admission: RE | Admit: 2015-08-12 | Discharge: 2015-08-12 | Disposition: A | Discharge status: Home / Self Care | Payer: Medicaid Other | Source: Ambulatory Visit | Attending: Radiation Oncology | Admitting: Radiation Oncology

## 2015-08-12 ENCOUNTER — Ambulatory Visit: Payer: Medicaid Other

## 2015-08-12 ENCOUNTER — Ambulatory Visit: Admission: RE | Admit: 2015-08-12 | Payer: Medicaid Other | Source: Ambulatory Visit

## 2015-08-13 ENCOUNTER — Telehealth: Payer: Self-pay | Admitting: *Deleted

## 2015-08-13 ENCOUNTER — Emergency Department (HOSPITAL_COMMUNITY)
Admission: EM | Admit: 2015-08-13 | Discharge: 2015-08-13 | Disposition: A | Discharge status: Home / Self Care | Payer: Medicaid Other | Attending: Emergency Medicine | Admitting: Emergency Medicine

## 2015-08-13 ENCOUNTER — Ambulatory Visit: Payer: Medicaid Other

## 2015-08-13 ENCOUNTER — Encounter (HOSPITAL_COMMUNITY): Payer: Self-pay | Admitting: Emergency Medicine

## 2015-08-13 ENCOUNTER — Ambulatory Visit: Payer: Medicaid Other | Admitting: Radiation Oncology

## 2015-08-13 DIAGNOSIS — C44692 Other specified malignant neoplasm of skin of right upper limb, including shoulder: Secondary | ICD-10-CM

## 2015-08-13 DIAGNOSIS — Z79899 Other long term (current) drug therapy: Secondary | ICD-10-CM | POA: Insufficient documentation

## 2015-08-13 DIAGNOSIS — Z87891 Personal history of nicotine dependence: Secondary | ICD-10-CM | POA: Diagnosis not present

## 2015-08-13 DIAGNOSIS — Z98811 Dental restoration status: Secondary | ICD-10-CM | POA: Insufficient documentation

## 2015-08-13 DIAGNOSIS — K219 Gastro-esophageal reflux disease without esophagitis: Secondary | ICD-10-CM | POA: Diagnosis not present

## 2015-08-13 DIAGNOSIS — C4499 Other specified malignant neoplasm of skin, unspecified: Secondary | ICD-10-CM | POA: Insufficient documentation

## 2015-08-13 DIAGNOSIS — Z8679 Personal history of other diseases of the circulatory system: Secondary | ICD-10-CM | POA: Diagnosis not present

## 2015-08-13 DIAGNOSIS — Z792 Long term (current) use of antibiotics: Secondary | ICD-10-CM | POA: Insufficient documentation

## 2015-08-13 DIAGNOSIS — M25511 Pain in right shoulder: Secondary | ICD-10-CM | POA: Diagnosis present

## 2015-08-13 DIAGNOSIS — R52 Pain, unspecified: Secondary | ICD-10-CM

## 2015-08-13 MED ORDER — OXYCODONE HCL 5 MG PO TABS: 10.0000 mg | ORAL_TABLET | Freq: Four times a day (QID) | ORAL | Status: DC | PRN

## 2015-08-13 MED ORDER — OXYCODONE HCL 5 MG PO TABS
10.0000 mg | ORAL_TABLET | Freq: Once | ORAL | Status: DC
  Filled 2015-08-13: qty 2

## 2015-08-13 NOTE — Discharge Instructions (Signed)
Chronic cancer Pain Call your oncologist to discuss ongoing needs for pain management in association with your tumor. If you have new or changing symptoms or other concerns return to the emergency Department for reassessment. Chronic pain can be defined as pain that is off and on and lasts for 3-6 months or longer. Many things cause chronic pain, which can make it difficult to make a diagnosis. There are many treatment options available for chronic pain. However, finding a treatment that works well for you may require trying various approaches until the right one is found. Many people benefit from a combination of two or more types of treatment to control their pain. SYMPTOMS  Chronic pain can occur anywhere in the body and can range from mild to very severe. Some types of chronic pain include:  Headache.  Low back pain.  Cancer pain.  Arthritis pain.  Neurogenic pain. This is pain resulting from damage to nerves. People with chronic pain may also have other symptoms such as:  Depression.  Anger.  Insomnia.  Anxiety. DIAGNOSIS  Your health care provider will help diagnose your condition over time. In many cases, the initial focus will be on excluding possible conditions that could be causing the pain. Depending on your symptoms, your health care provider may order tests to diagnose your condition. Some of these tests may include:   Blood tests.   CT scan.   MRI.   X-rays.   Ultrasounds.   Nerve conduction studies.  You may need to see a specialist.  TREATMENT  Finding treatment that works well may take time. You may be referred to a pain specialist. He or she may prescribe medicine or therapies, such as:   Mindful meditation or yoga.  Shots (injections) of numbing or pain-relieving medicines into the spine or area of pain.  Local electrical stimulation.  Acupuncture.   Massage therapy.   Aroma, color, light, or sound therapy.   Biofeedback.   Working  with a physical therapist to keep from getting stiff.   Regular, gentle exercise.   Cognitive or behavioral therapy.   Group support.  Sometimes, surgery may be recommended.  HOME CARE INSTRUCTIONS   Take all medicines as directed by your health care provider.   Lessen stress in your life by relaxing and doing things such as listening to calming music.   Exercise or be active as directed by your health care provider.   Eat a healthy diet and include things such as vegetables, fruits, fish, and lean meats in your diet.   Keep all follow-up appointments with your health care provider.   Attend a support group with others suffering from chronic pain. SEEK MEDICAL CARE IF:   Your pain gets worse.   You develop a new pain that was not there before.   You cannot tolerate medicines given to you by your health care provider.   You have new symptoms since your last visit with your health care provider.  SEEK IMMEDIATE MEDICAL CARE IF:   You feel weak.   You have decreased sensation or numbness.   You lose control of bowel or bladder function.   Your pain suddenly gets much worse.   You develop shaking.  You develop chills.  You develop confusion.  You develop chest pain.  You develop shortness of breath.  MAKE SURE YOU:  Understand these instructions.  Will watch your condition.  Will get help right away if you are not doing well or get worse.   This  information is not intended to replace advice given to you by your health care provider. Make sure you discuss any questions you have with your health care provider.   Document Released: 06/18/2002 Document Revised: 05/29/2013 Document Reviewed: 03/22/2013 Elsevier Interactive Patient Education [employer].

## 2015-08-13 NOTE — Telephone Encounter (Signed)
Pharmacy called related to Rx:  oxyCODONE (OXY IR/ROXICODONE) 5 MG immediate release tablet being too soon to refill....NCM clarified with EDP to not fill at this time.

## 2015-08-13 NOTE — ED Notes (Signed)
Pt c/o rt shoulder pain since last night.  States that she had back surgery in august and states that she thinks its from that.  Denies injury.

## 2015-08-13 NOTE — ED Notes (Signed)
Pt educated not to drink alcohol or drive on narcotic meds.

## 2015-08-13 NOTE — ED Provider Notes (Signed)
CSN: [id number]     Arrival date & time 08/13/15  0998 History   First MD Initiated Contact with Patient 08/13/15 [phone]     Chief Complaint  Patient presents with  . 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 Patient had wide excision done 07/09/2015. She reports she is due to start radiation therapy in the next 4 days. She states she has burning pain over the entirety of her posterior shoulder in the trapezius area that radiates somewhat forward towards her clavicle. She reports that she has no shortness of breath or cough in association with this she's had no fever. She feels that the pain has been incrementally increasing but got much worse over the past several days. She feels that this is a continuation of the same pain she was experiencing. She had been taking Norco but only gets minimal relief. She reports she is taking a 7.5 mg tablet every 4 hours. No other associated new symptoms. Past Medical History  Diagnosis Date  . GERD (gastroesophageal reflux disease)     occasional  . Wears partial dentures     lower  . Wears dentures     upper  . Migraines     occasional  . Cancer ([place]) 06/2015    right  shoulder dermatofibrosarcoma protiberans   Past Surgical History  Procedure Laterality Date  . Dilation and curettage of uterus    . Tubal ligation  09/25/2005  . Multiple tooth extractions    . Dilation and evacuation  06/12/2001  . Lipoma excision Right 05/26/2015    Procedure: EXCISION CYST ON RIGHT SHOULDER;  Surgeon: Erroll Luna, MD;  Location: [facility];  Service: General;  Laterality: Right;   No family history on file. Social History  Substance Use Topics  . Smoking status: Former Smoker -- 10 years    Quit date: 06/21/2015  . Smokeless tobacco: Never Used  . Alcohol Use: No   OB History    No data  available     Review of Systems 10 Systems reviewed and are negative for acute change except as noted in the HPI.    Allergies  Tetracyclines & related  Home Medications   Prior to Admission medications   Medication Sig Start Date End Date Taking? Authorizing Provider  cephALEXin (KEFLEX) 500 MG capsule Take 500 mg by mouth 3 (three) times daily. 06/19/15   Historical Provider, MD  esomeprazole (NEXIUM) 20 MG capsule Take 20 mg by mouth daily at 12 noon.    Historical Provider, MD  megestrol (MEGACE) 40 MG/ML suspension Take by mouth 2 (two) times daily.    Historical Provider, MD  oxyCODONE (OXY IR/ROXICODONE) 5 MG immediate release tablet Take 2 tablets (10 mg total) by mouth every 6 (six) hours as needed for severe pain. 08/13/15   Charlesetta Shanks, MD  OxyCODONE HCl (ROXICODONE PO) Take 10 mg by mouth as needed.     Historical Provider, MD  oxyCODONE-acetaminophen (ROXICET) 5-325 MG tablet Take 1-2 tablets by mouth every 4 (four) hours as needed. Patient not taking: Reported on 07/28/2015 07/09/15   Erroll Luna, MD   BP 111/70 mmHg  Pulse 96  Temp(Src) 97.9 F (36.6 C) (Oral)  Resp 16  SpO2 100% Physical Exam  Constitutional: She is oriented to person, place, and time. She appears well-developed and well-nourished. No distress.  HENT:  Head: Normocephalic and atraumatic.  Right Ear: External ear  normal.  Left Ear: External ear normal.  Eyes: EOM are normal.  Neck: Neck supple.  Cardiovascular: Normal rate, regular rhythm, normal heart sounds and intact distal pulses.   Pulmonary/Chest: Effort normal and breath sounds normal.  Musculoskeletal:  Patient has intact range of motion of both upper extremities. There is no edema of the right upper extremity. Muscular function is intact. She does have a scar and deformity on the trapezius area of her back. See attached images.  Neurological: She is alert and oriented to person, place, and time. Coordination normal.  Skin: Skin is  warm and dry.  Psychiatric: She has a normal mood and affect.        ED Course  Procedures (including critical care time) Labs Review Labs Reviewed - No data to display  Imaging Review No results found. I have personally reviewed and evaluated these images and lab results as part of my medical decision-making.   EKG Interpretation None      MDM   Final diagnoses:  Dermatofibrosarcoma protubera of right shoulder  Inadequate pain control   The patient has an invasive dermatofibrosarcoma. She did have to have a wide excision and now will be undergoing radiation therapy. The patient describes burning pain throughout the region. She reports that she is not getting pain relief with Norco 7.5mg  tablets. It does not appear that there are acute complications at this time, she does not have respiratory complaints, fever or changes at the wound site. She does feel that this is a pain management problem more than an immediate change in her condition. The patient will be provided with oxycodone immediate release for temporary pain control. She is advised to discuss ongoing pain management with her oncologist.  07/09/2015  Charlesetta Shanks, MD 08/13/15 1000

## 2015-08-14 ENCOUNTER — Ambulatory Visit: Payer: Medicaid Other

## 2015-08-17 ENCOUNTER — Encounter: Payer: Self-pay | Admitting: Radiation Oncology

## 2015-08-17 ENCOUNTER — Ambulatory Visit
Admission: RE | Admit: 2015-08-17 | Discharge: 2015-08-17 | Disposition: A | Discharge status: Home / Self Care | Payer: Medicaid Other | Source: Ambulatory Visit | Attending: Radiation Oncology | Admitting: Radiation Oncology

## 2015-08-17 VITALS — BP 96/67 | HR 77 | Temp 97.3°F | Ht 63.0 in | Wt 106.8 lb

## 2015-08-17 DIAGNOSIS — C44692 Other specified malignant neoplasm of skin of right upper limb, including shoulder: Secondary | ICD-10-CM

## 2015-08-17 DIAGNOSIS — Z510 Encounter for antineoplastic radiation therapy: Secondary | ICD-10-CM | POA: Diagnosis not present

## 2015-08-17 MED ORDER — SONAFINE EX EMUL
1.0000 "application " | Freq: Two times a day (BID) | CUTANEOUS | Status: DC
  Administered 2015-08-17: 1 via TOPICAL
  Filled 2015-08-17: qty 45

## 2015-08-17 NOTE — Progress Notes (Signed)
Please see dictated note from weekly management note from earlier today.

## 2015-08-17 NOTE — Progress Notes (Signed)
Gail Tucker has received 1 fraction to her right shoulder.  She report that this area is very itchy and tender to touch.  She experiences shooting pains and explained that this is due to the irritation of the nerves in her surgical area.

## 2015-08-17 NOTE — Progress Notes (Signed)
Weekly Management Note:  Site: Posterior right shoulder Current Dose:  200  cGy Projected Dose: 6000  cGy  Narrative: The patient is seen today for routine under treatment assessment. CBCT/MVCT images/port films were reviewed. The chart was reviewed.   She is without new complaints today.  She has occasional shooting pains along her posterior right shoulder/tumor bed.  Physical Examination: There were no vitals filed for this visit..  Weight:  .  Her wound is healed.  No signs of infection.  Impression: Tolerating radiation therapy well.  Plan: Continue radiation therapy as planned.

## 2015-08-18 ENCOUNTER — Ambulatory Visit: Payer: Medicaid Other | Admitting: Nutrition

## 2015-08-18 ENCOUNTER — Ambulatory Visit
Admission: RE | Admit: 2015-08-18 | Discharge: 2015-08-18 | Disposition: A | Discharge status: Home / Self Care | Payer: Medicaid Other | Source: Ambulatory Visit | Attending: Radiation Oncology | Admitting: Radiation Oncology

## 2015-08-18 DIAGNOSIS — Z510 Encounter for antineoplastic radiation therapy: Secondary | ICD-10-CM | POA: Diagnosis not present

## 2015-08-18 NOTE — Progress Notes (Signed)
Patient identified by nursing.  This patient was requesting samples of ensure.   39 year old female diagnosed with dermatofibrosarcoma.  She is receiving radiation therapy and is a patient of Dr. Valere Dross.  Past medical history includes GERD, migraines and dentures.  Medications include Nexium, Megace  Labs include were reviewed.  Height: 63 inches. Weight: 106.8 pounds. Usual body weight: 120 pounds. BMI: 18.92.  Patient reports she has no appetite.  Reports it is very difficult to eat. States M.D. has increased her dosage of Megace hoping to improve appetite. Patient does have occasional nausea but attributes this to nerves.  Severe malnutrition related to inadequate oral intake as evidenced by 11% weight loss from usual body weight, and depletion of both fat and muscle stores on physical exam.  Nutrition diagnosis: Inadequate oral intake related to poor appetite as evidenced by BMI of 18.9-11% weight loss.  Intervention: Educated patient to consume 3 meals +3 snacks daily. Reviewed ways to increase calories and protein and provided fact sheet Provided a fact sheet on poor appetite. Recommended patient try oral nutrition supplements 2-3 times daily and provided samples and coupons. Questions were answered.  Teach back method used.  Contact information was provided.  Monitoring, evaluation, goals: Patient will tolerate increased calories and protein to minimize further weight loss.  Next visit: Patient will contact me for further needs.  **Disclaimer: This note was dictated with voice recognition software. Similar sounding words can inadvertently be transcribed and this note may contain transcription errors which may not have been corrected upon publication of note.**

## 2015-08-19 ENCOUNTER — Ambulatory Visit
Admission: RE | Admit: 2015-08-19 | Discharge: 2015-08-19 | Disposition: A | Discharge status: Home / Self Care | Payer: Medicaid Other | Source: Ambulatory Visit | Attending: Radiation Oncology | Admitting: Radiation Oncology

## 2015-08-19 DIAGNOSIS — Z510 Encounter for antineoplastic radiation therapy: Secondary | ICD-10-CM | POA: Diagnosis not present

## 2015-08-20 ENCOUNTER — Ambulatory Visit
Admission: RE | Admit: 2015-08-20 | Discharge: 2015-08-20 | Disposition: A | Discharge status: Home / Self Care | Payer: Medicaid Other | Source: Ambulatory Visit | Attending: Radiation Oncology | Admitting: Radiation Oncology

## 2015-08-20 DIAGNOSIS — Z510 Encounter for antineoplastic radiation therapy: Secondary | ICD-10-CM | POA: Diagnosis not present

## 2015-08-21 ENCOUNTER — Ambulatory Visit
Admission: RE | Admit: 2015-08-21 | Discharge: 2015-08-21 | Disposition: A | Discharge status: Home / Self Care | Payer: Medicaid Other | Source: Ambulatory Visit | Attending: Radiation Oncology | Admitting: Radiation Oncology

## 2015-08-21 DIAGNOSIS — Z510 Encounter for antineoplastic radiation therapy: Secondary | ICD-10-CM | POA: Diagnosis not present

## 2015-08-24 ENCOUNTER — Ambulatory Visit
Admission: RE | Admit: 2015-08-24 | Discharge: 2015-08-24 | Disposition: A | Discharge status: Home / Self Care | Payer: Medicaid Other | Source: Ambulatory Visit | Attending: Radiation Oncology | Admitting: Radiation Oncology

## 2015-08-24 ENCOUNTER — Encounter: Payer: Self-pay | Admitting: Radiation Oncology

## 2015-08-24 VITALS — BP 102/69 | HR 81 | Temp 98.0°F | Ht 63.0 in | Wt 110.0 lb

## 2015-08-24 DIAGNOSIS — Z510 Encounter for antineoplastic radiation therapy: Secondary | ICD-10-CM | POA: Diagnosis not present

## 2015-08-24 DIAGNOSIS — C44692 Other specified malignant neoplasm of skin of right upper limb, including shoulder: Secondary | ICD-10-CM | POA: Diagnosis not present

## 2015-08-24 MED ORDER — SONAFINE EX EMUL
1.0000 "application " | Freq: Two times a day (BID) | CUTANEOUS | Status: DC
  Administered 2015-08-24: 1 via TOPICAL
  Filled 2015-08-24: qty 45

## 2015-08-24 NOTE — Progress Notes (Signed)
Weekly Management Note:  Site: Posterior right shoulder  Current Dose:  1200  cGy Projected Dose: 5000  cGy followed by 5 fraction boost Narrative: The patient is seen today for routine under treatment assessment. CBCT/MVCT images/port films were reviewed. The chart was reviewed.  She does report slight pruritus.  She uses Sonafine cream.  Physical Examination:  Filed Vitals:   08/24/15 1119  BP: 102/69  Pulse: 81  Temp: 98 F (36.7 C)  .  Weight: 110 lb (49.896 kg).  On inspection the posterior right shoulder there is faint hyperpigmentation the skin.  There are no scabs along the wound.  She is well-healed.  Impression: Tolerating radiation therapy well.  Plan: Continue radiation therapy as planned.

## 2015-08-24 NOTE — Addendum Note (Signed)
Encounter addended by: Margaret Pyle, [place] on: 08/24/2015 12:58 PM<BR>     Documentation filed: Rx Order Verification

## 2015-08-24 NOTE — Addendum Note (Signed)
Encounter addended by: Benn Moulder, RN on: 08/24/2015 12:38 PM<BR>     Documentation filed: Inpatient Patient Education

## 2015-08-24 NOTE — Progress Notes (Addendum)
Gail Tucker has received 6 fractions to her right shoulder.  Given more Biafine.  C/o itching ion her tx field.  Skin remains intact with small scab.

## 2015-08-24 NOTE — Addendum Note (Signed)
Encounter addended by: Benn Moulder, RN on: 08/24/2015 12:51 PM<BR>     Documentation filed: Orders, Dx Association, Inpatient Patient Education, Inpatient [facility]

## 2015-08-25 ENCOUNTER — Ambulatory Visit
Admission: RE | Admit: 2015-08-25 | Discharge: 2015-08-25 | Disposition: A | Discharge status: Home / Self Care | Payer: Medicaid Other | Source: Ambulatory Visit | Attending: Radiation Oncology | Admitting: Radiation Oncology

## 2015-08-25 DIAGNOSIS — Z510 Encounter for antineoplastic radiation therapy: Secondary | ICD-10-CM | POA: Diagnosis not present

## 2015-08-26 ENCOUNTER — Ambulatory Visit
Admission: RE | Admit: 2015-08-26 | Discharge: 2015-08-26 | Disposition: A | Discharge status: Home / Self Care | Payer: Medicaid Other | Source: Ambulatory Visit | Attending: Radiation Oncology | Admitting: Radiation Oncology

## 2015-08-26 DIAGNOSIS — Z510 Encounter for antineoplastic radiation therapy: Secondary | ICD-10-CM | POA: Diagnosis not present

## 2015-08-27 ENCOUNTER — Ambulatory Visit
Admission: RE | Admit: 2015-08-27 | Discharge: 2015-08-27 | Disposition: A | Discharge status: Home / Self Care | Payer: Medicaid Other | Source: Ambulatory Visit | Attending: Radiation Oncology | Admitting: Radiation Oncology

## 2015-08-27 DIAGNOSIS — Z510 Encounter for antineoplastic radiation therapy: Secondary | ICD-10-CM | POA: Diagnosis not present

## 2015-08-28 ENCOUNTER — Emergency Department (HOSPITAL_COMMUNITY)
Admission: EM | Admit: 2015-08-28 | Discharge: 2015-08-28 | Disposition: A | Discharge status: Home / Self Care | Payer: Medicaid Other | Attending: Emergency Medicine | Admitting: Emergency Medicine

## 2015-08-28 ENCOUNTER — Encounter (HOSPITAL_COMMUNITY): Payer: Self-pay | Admitting: Emergency Medicine

## 2015-08-28 ENCOUNTER — Ambulatory Visit
Admission: RE | Admit: 2015-08-28 | Discharge: 2015-08-28 | Disposition: A | Discharge status: Home / Self Care | Payer: Medicaid Other | Source: Ambulatory Visit | Attending: Radiation Oncology | Admitting: Radiation Oncology

## 2015-08-28 ENCOUNTER — Emergency Department (HOSPITAL_COMMUNITY): Payer: Medicaid Other

## 2015-08-28 DIAGNOSIS — Z510 Encounter for antineoplastic radiation therapy: Secondary | ICD-10-CM | POA: Diagnosis not present

## 2015-08-28 DIAGNOSIS — M25511 Pain in right shoulder: Secondary | ICD-10-CM | POA: Diagnosis present

## 2015-08-28 DIAGNOSIS — K219 Gastro-esophageal reflux disease without esophagitis: Secondary | ICD-10-CM | POA: Insufficient documentation

## 2015-08-28 DIAGNOSIS — Z8679 Personal history of other diseases of the circulatory system: Secondary | ICD-10-CM | POA: Insufficient documentation

## 2015-08-28 DIAGNOSIS — Z87891 Personal history of nicotine dependence: Secondary | ICD-10-CM | POA: Insufficient documentation

## 2015-08-28 DIAGNOSIS — Z98811 Dental restoration status: Secondary | ICD-10-CM | POA: Insufficient documentation

## 2015-08-28 DIAGNOSIS — R0602 Shortness of breath: Secondary | ICD-10-CM | POA: Insufficient documentation

## 2015-08-28 DIAGNOSIS — C44692 Other specified malignant neoplasm of skin of right upper limb, including shoulder: Secondary | ICD-10-CM

## 2015-08-28 DIAGNOSIS — Z79899 Other long term (current) drug therapy: Secondary | ICD-10-CM | POA: Diagnosis not present

## 2015-08-28 LAB — CBC WITH DIFFERENTIAL/PLATELET
Basophils Absolute: 0 10*3/uL (ref 0.0–0.1)
Basophils Relative: 0 %
EOS PCT: 1 %
Eosinophils Absolute: 0.1 10*3/uL (ref 0.0–0.7)
HEMATOCRIT: 39.9 % (ref 36.0–46.0)
Hemoglobin: 13.6 g/dL (ref 12.0–15.0)
LYMPHS ABS: 3.1 10*3/uL (ref 0.7–4.0)
LYMPHS PCT: 46 %
MCH: 33.5 pg (ref 26.0–34.0)
MCHC: 34.1 g/dL (ref 30.0–36.0)
MCV: 98.3 fL (ref 78.0–100.0)
Monocytes Absolute: 0.6 10*3/uL (ref 0.1–1.0)
Monocytes Relative: 9 %
NEUTROS ABS: 3 10*3/uL (ref 1.7–7.7)
Neutrophils Relative %: 44 %
PLATELETS: 146 10*3/uL — AB (ref 150–400)
RBC: 4.06 MIL/uL (ref 3.87–5.11)
RDW: 12.9 % (ref 11.5–15.5)
WBC: 6.8 10*3/uL (ref 4.0–10.5)

## 2015-08-28 LAB — BASIC METABOLIC PANEL
Anion gap: 5 (ref 5–15)
BUN: 5 mg/dL — AB (ref 6–20)
CHLORIDE: 112 mmol/L — AB (ref 101–111)
CO2: 24 mmol/L (ref 22–32)
Calcium: 8.9 mg/dL (ref 8.9–10.3)
Creatinine, Ser: 0.69 mg/dL (ref 0.44–1.00)
GFR calc Af Amer: >60 mL/min (ref >60)
GLUCOSE: 84 mg/dL (ref 65–99)
POTASSIUM: 3.2 mmol/L — AB (ref 3.5–5.1)
Sodium: 141 mmol/L (ref 135–145)

## 2015-08-28 MED ORDER — ONDANSETRON HCL 4 MG/2ML IJ SOLN
4.0000 mg | Freq: Once | INTRAMUSCULAR | Status: AC
  Administered 2015-08-28: 4 mg via INTRAVENOUS
  Filled 2015-08-28: qty 2

## 2015-08-28 MED ORDER — OXYCODONE-ACETAMINOPHEN 5-325 MG PO TABS: 1.0000 | ORAL_TABLET | ORAL | Status: DC | PRN

## 2015-08-28 MED ORDER — MORPHINE SULFATE (PF) 4 MG/ML IV SOLN
4.0000 mg | Freq: Once | INTRAVENOUS | Status: AC
  Administered 2015-08-28: 4 mg via INTRAVENOUS
  Filled 2015-08-28: qty 1

## 2015-08-28 MED ORDER — SODIUM CHLORIDE 0.9 % IV BOLUS (SEPSIS)
1000.0000 mL | Freq: Once | INTRAVENOUS | Status: AC
  Administered 2015-08-28: 1000 mL via INTRAVENOUS

## 2015-08-28 NOTE — ED Provider Notes (Addendum)
CSN: [id number]     Arrival date & time 08/28/15  1036 History   First MD Initiated Contact with Patient 08/28/15 1137     Chief Complaint  Patient presents with  . Shortness of Breath     (Consider location/radiation/quality/duration/timing/severity/associated sxs/prior Treatment) HPI.... Pain in the posterior aspect of the right shoulder for several days. She is status post excision of dermatofibrosarcoma on 07/09/2015 in the same area. No anterior chest pain, dyspnea, fever, sweats, chills, leg discomfort. This is not a new pain. Severity is moderate.  Past Medical History  Diagnosis Date  . GERD (gastroesophageal reflux disease)     occasional  . Wears partial dentures     lower  . Wears dentures     upper  . Migraines     occasional  . Cancer ([place]) 06/2015    right  shoulder dermatofibrosarcoma protiberans   Past Surgical History  Procedure Laterality Date  . Dilation and curettage of uterus    . Tubal ligation  09/25/2005  . Multiple tooth extractions    . Dilation and evacuation  06/12/2001  . Lipoma excision Right 05/26/2015    Procedure: EXCISION CYST ON RIGHT SHOULDER;  Surgeon: Erroll Luna, MD;  Location: [facility];  Service: General;  Laterality: Right;   History reviewed. No pertinent family history. Social History  Substance Use Topics  . Smoking status: Former Smoker -- 10 years    Quit date: 06/21/2015  . Smokeless tobacco: Never Used  . Alcohol Use: No   OB History    No data available     Review of Systems  All other systems reviewed and are negative.     Allergies  Tetracyclines & related  Home Medications   Prior to Admission medications   Medication Sig Start Date End Date Taking? Authorizing Provider  esomeprazole (NEXIUM) 20 MG capsule Take 20 mg by mouth daily as needed (indigestion.).    Yes Historical Provider, MD  megestrol (MEGACE) 40 MG/ML suspension Take 200 mg by mouth 2 (two) times daily.    Yes Historical  Provider, MD  oxyCODONE (OXY IR/ROXICODONE) 5 MG immediate release tablet Take 2 tablets (10 mg total) by mouth every 6 (six) hours as needed for severe pain. 08/13/15  Yes Charlesetta Shanks, MD  oxyCODONE-acetaminophen (PERCOCET) 5-325 MG tablet Take 1-2 tablets by mouth every 4 (four) hours as needed. 08/28/15   Nat Christen, MD   BP 110/73 mmHg  Pulse 65  Temp(Src) 98.3 F (36.8 C) (Oral)  Resp 16  SpO2 99%  LMP 08/07/2015 Physical Exam  Constitutional: She is oriented to person, place, and time. She appears well-developed and well-nourished.  HENT:  Head: Normocephalic and atraumatic.  Eyes: Conjunctivae and EOM are normal. Pupils are equal, round, and reactive to light.  Neck: Normal range of motion. Neck supple.  Cardiovascular: Normal rate and regular rhythm.   Pulmonary/Chest: Effort normal and breath sounds normal.  Abdominal: Soft. Bowel sounds are normal.  Musculoskeletal: Normal range of motion.  Neurological: She is alert and oriented to person, place, and time.  Skin:  Healing scar over her posterior right shoulder.  No evidence of cellulitis.  Psychiatric: She has a normal mood and affect. Her behavior is normal.  Nursing note and vitals reviewed.   ED Course  Procedures (including critical care time) Labs Review Labs Reviewed  CBC WITH DIFFERENTIAL/PLATELET - Abnormal; Notable for the following:    Platelets 146 (*)    All other components within normal limits  BASIC METABOLIC PANEL - Abnormal; Notable for the following:    Potassium 3.2 (*)    Chloride 112 (*)    BUN 5 (*)    All other components within normal limits    Imaging Review Dg Chest 2 View  08/28/2015  CLINICAL DATA:  Shortness of breath, chest pain. EXAM: CHEST  2 VIEW COMPARISON:  January 07, 2009. FINDINGS: The heart size and mediastinal contours are within normal limits. Both lungs are clear. No pneumothorax or pleural effusion is noted. Stable mild dextroscoliosis of lower thoracic spine is noted.  IMPRESSION: No active cardiopulmonary disease. Electronically Signed   By: Marijo Conception, M.D.   On: 08/28/2015 12:43   I have personally reviewed and evaluated these images and lab results as part of my medical decision-making.   EKG Interpretation   Date/Time:  Friday August 28 2015 12:09:49 EST Ventricular Rate:  69 PR Interval:  129 QRS Duration: 98 QT Interval:  392 QTC Calculation: 420 R Axis:   24 Text Interpretation:  Sinus rhythm Borderline T abnormalities, anterior  leads ST elev, probable normal early repol pattern No previous tracing  Confirmed by KNAPP  MD-J, JON [id number]) on 08/28/2015 12:12:43 PM      MDM   Final diagnoses:  Dermatofibrosarcoma protubera of right shoulder    Patient has known dermatofibroma fibrosarcoma of the right shoulder. Screening tests were negative. No evidence of acute coronary syndrome or pulmonary embolism.    Nat Christen, MD 08/28/15 Eva, MD 09/10/15 [phone]

## 2015-08-28 NOTE — ED Notes (Signed)
Patient here with complaints of SOB and pain when taking a deep breath. States "I think I have pneumonia again". Hx cancer, receiving radiation.

## 2015-08-28 NOTE — Discharge Instructions (Signed)
Medications for pain. Follow-up your cancer doctor on Monday. Tests were normal today.

## 2015-08-30 ENCOUNTER — Ambulatory Visit
Admission: RE | Admit: 2015-08-30 | Discharge: 2015-08-30 | Disposition: A | Discharge status: Home / Self Care | Payer: Medicaid Other | Source: Ambulatory Visit | Attending: Radiation Oncology | Admitting: Radiation Oncology

## 2015-08-30 DIAGNOSIS — Z510 Encounter for antineoplastic radiation therapy: Secondary | ICD-10-CM | POA: Diagnosis not present

## 2015-08-31 ENCOUNTER — Encounter: Payer: Self-pay | Admitting: Radiation Oncology

## 2015-08-31 ENCOUNTER — Ambulatory Visit
Admission: RE | Admit: 2015-08-31 | Discharge: 2015-08-31 | Disposition: A | Discharge status: Home / Self Care | Payer: Medicaid Other | Source: Ambulatory Visit | Attending: Radiation Oncology | Admitting: Radiation Oncology

## 2015-08-31 VITALS — BP 101/69 | HR 78 | Temp 99.2°F | Resp 12 | Wt 111.3 lb

## 2015-08-31 DIAGNOSIS — Z510 Encounter for antineoplastic radiation therapy: Secondary | ICD-10-CM | POA: Diagnosis not present

## 2015-08-31 DIAGNOSIS — C44692 Other specified malignant neoplasm of skin of right upper limb, including shoulder: Secondary | ICD-10-CM

## 2015-08-31 NOTE — Progress Notes (Signed)
Weekly Management Note:  Site: Posterior right shoulder Current Dose:  2400  cGy Projected Dose: 6000  cGy  Narrative: The patient is seen today for routine under treatment assessment. CBCT/MVCT images/port films were reviewed. The chart was reviewed.   She is without complaints today except for occasional "tingling" and sharp pain radiating to her shoulder.  Physical Examination:  Filed Vitals:   08/31/15 1104  BP: 101/69  Pulse: 78  Temp: 99.2 F (37.3 C)  Resp: 12  .  Weight: 111 lb 4.8 oz (50.485 kg).  There is hyperpigmentation the skin along the right posterior shoulder/treatment field.  No areas of desquamation.  Impression: Tolerating radiation therapy well.  Plan: Continue radiation therapy as planned.

## 2015-08-31 NOTE — Progress Notes (Signed)
She is currently in no pain. Reports "tingling" and sharp pain to right shoulder occasionally. Pt complains of skin sensitiveness and itching, Chills, Fatigue and Generalized Weakness. She reports headaches.  BP 101/69 mmHg  Pulse 78  Temp(Src) 99.2 F (37.3 C) (Oral)  Resp 12  Wt 111 lb 4.8 oz (50.485 kg)  SpO2 100%  LMP 08/07/2015

## 2015-09-01 ENCOUNTER — Ambulatory Visit
Admission: RE | Admit: 2015-09-01 | Discharge: 2015-09-01 | Disposition: A | Discharge status: Home / Self Care | Payer: Medicaid Other | Source: Ambulatory Visit | Attending: Radiation Oncology | Admitting: Radiation Oncology

## 2015-09-01 DIAGNOSIS — Z510 Encounter for antineoplastic radiation therapy: Secondary | ICD-10-CM | POA: Diagnosis not present

## 2015-09-02 ENCOUNTER — Ambulatory Visit: Admission: RE | Admit: 2015-09-02 | Payer: Medicaid Other | Source: Ambulatory Visit

## 2015-09-02 ENCOUNTER — Ambulatory Visit
Admission: RE | Admit: 2015-09-02 | Discharge: 2015-09-02 | Disposition: A | Discharge status: Home / Self Care | Payer: Medicaid Other | Source: Ambulatory Visit | Attending: Radiation Oncology | Admitting: Radiation Oncology

## 2015-09-03 ENCOUNTER — Ambulatory Visit: Payer: Medicaid Other

## 2015-09-05 ENCOUNTER — Ambulatory Visit: Payer: Medicaid Other | Attending: Radiation Oncology | Admitting: Radiation Oncology

## 2015-09-07 ENCOUNTER — Ambulatory Visit
Admission: RE | Admit: 2015-09-07 | Discharge: 2015-09-07 | Disposition: A | Discharge status: Home / Self Care | Payer: Medicaid Other | Source: Ambulatory Visit | Attending: Radiation Oncology | Admitting: Radiation Oncology

## 2015-09-07 VITALS — BP 103/75 | HR 85 | Temp 98.6°F | Ht 63.0 in | Wt 107.7 lb

## 2015-09-07 DIAGNOSIS — Z510 Encounter for antineoplastic radiation therapy: Secondary | ICD-10-CM | POA: Diagnosis not present

## 2015-09-07 DIAGNOSIS — C44692 Other specified malignant neoplasm of skin of right upper limb, including shoulder: Secondary | ICD-10-CM

## 2015-09-07 NOTE — Progress Notes (Signed)
Weekly Management Note:  Site: Posterior right shoulder Current Dose:   2800  cGy Projected Dose:  5000  cGy  Followed by boost of 1000 cGy in 5 sessions  Narrative: The patient is seen today for routine under treatment assessment. CBCT/MVCT images/port films were reviewed. The chart was reviewed.    She does report a burning sensation along her right posterior shoulder along with continued "tingling".  She is to see Dr. Brantley Stage today.  Physical Examination:  Filed Vitals:   09/07/15 1055  BP: 103/75  Pulse: 85  Temp: 98.6 F (37 C)  .  Weight: 107 lb 11.2 oz (48.852 kg).   There is more hyperpigmentation the skin within her treatment field with no areas of desquamation. Her wound remains well healed.  Impression: Tolerating radiation therapy well.  Plan: Continue radiation therapy as planned.

## 2015-09-08 ENCOUNTER — Ambulatory Visit
Admission: RE | Admit: 2015-09-08 | Discharge: 2015-09-08 | Disposition: A | Discharge status: Home / Self Care | Payer: Medicaid Other | Source: Ambulatory Visit | Attending: Radiation Oncology | Admitting: Radiation Oncology

## 2015-09-08 ENCOUNTER — Ambulatory Visit: Payer: Medicaid Other

## 2015-09-08 DIAGNOSIS — Z510 Encounter for antineoplastic radiation therapy: Secondary | ICD-10-CM | POA: Diagnosis not present

## 2015-09-09 ENCOUNTER — Ambulatory Visit: Payer: Medicaid Other

## 2015-09-09 ENCOUNTER — Ambulatory Visit
Admission: RE | Admit: 2015-09-09 | Discharge: 2015-09-09 | Disposition: A | Discharge status: Home / Self Care | Payer: Medicaid Other | Source: Ambulatory Visit | Attending: Radiation Oncology | Admitting: Radiation Oncology

## 2015-09-09 DIAGNOSIS — Z510 Encounter for antineoplastic radiation therapy: Secondary | ICD-10-CM | POA: Diagnosis not present

## 2015-09-10 ENCOUNTER — Ambulatory Visit: Payer: Medicaid Other

## 2015-09-10 ENCOUNTER — Ambulatory Visit: Payer: Medicaid Other | Admitting: Radiation Oncology

## 2015-09-11 ENCOUNTER — Ambulatory Visit: Payer: Medicaid Other

## 2015-09-11 ENCOUNTER — Ambulatory Visit
Admission: RE | Admit: 2015-09-11 | Discharge: 2015-09-11 | Disposition: A | Discharge status: Home / Self Care | Payer: Medicaid Other | Source: Ambulatory Visit | Attending: Radiation Oncology | Admitting: Radiation Oncology

## 2015-09-11 DIAGNOSIS — Z510 Encounter for antineoplastic radiation therapy: Secondary | ICD-10-CM | POA: Diagnosis not present

## 2015-09-14 ENCOUNTER — Ambulatory Visit
Admission: RE | Admit: 2015-09-14 | Discharge: 2015-09-14 | Disposition: A | Discharge status: Home / Self Care | Payer: Medicaid Other | Source: Ambulatory Visit | Attending: Radiation Oncology | Admitting: Radiation Oncology

## 2015-09-14 ENCOUNTER — Encounter: Payer: Self-pay | Admitting: Radiation Oncology

## 2015-09-14 ENCOUNTER — Ambulatory Visit: Payer: Medicaid Other | Admitting: Radiation Oncology

## 2015-09-14 VITALS — BP 114/80 | HR 80 | Temp 98.4°F | Ht 63.0 in | Wt 109.0 lb

## 2015-09-14 DIAGNOSIS — Z510 Encounter for antineoplastic radiation therapy: Secondary | ICD-10-CM | POA: Diagnosis not present

## 2015-09-14 DIAGNOSIS — C44692 Other specified malignant neoplasm of skin of right upper limb, including shoulder: Secondary | ICD-10-CM

## 2015-09-14 MED ORDER — RADIAPLEXRX EX GEL
Freq: Once | CUTANEOUS | Status: AC
  Administered 2015-09-14: 11:00:00 via TOPICAL

## 2015-09-14 NOTE — Progress Notes (Signed)
Weekly Management Note:  Site: Posterior right shoulder  Current Dose:  3600  cGy Projected Dose: 6000  cGy  Narrative: The patient is seen today for routine under treatment assessment. CBCT/MVCT images/port films were reviewed. The chart was reviewed.   She is without complaints today except for stinging and pruritus.  She is using Sonafine cream.  Physical Examination:  Filed Vitals:   09/14/15 1119  BP: 114/80  Pulse: 80  Temp: 98.4 F (36.9 C)  .  Weight: 109 lb (49.442 kg).  There is marked hyperpigmentation the skin along the right posterior shoulder with patchy dry desquamation.  Impression: Tolerating radiation therapy well except for the expected degree of radiation dermatitis.  We'll try Radioplex gel to see if this helps her with her pruritus.  If not we will try hydrocortisone cream.  Plan: Continue radiation therapy as planned.

## 2015-09-14 NOTE — Progress Notes (Signed)
Gail Tucker has received 18 fractions to her right upper shoulder region.  Note increased hyperpigmentation and dryness of the incision.  She continues to report increasing itching and stinging in the field as a level 10/10.  Per her request changed her lotion from Sonafine to Radiaplex since she states that Sonafine does not relieve her itching.

## 2015-09-14 NOTE — Progress Notes (Signed)
Complex simulation note: The patient underwent electron beam simulation for her reduced field to the posterior right shoulder.  She was set up clinically on the [facility].  The field was trimmed, primarily medially and laterally.  One custom block is constructed to conform the field.  A special port plan is requested.  I prescribing a further 1000 cGy in 5 sessions.

## 2015-09-15 ENCOUNTER — Ambulatory Visit
Admission: RE | Admit: 2015-09-15 | Discharge: 2015-09-15 | Disposition: A | Discharge status: Home / Self Care | Payer: Medicaid Other | Source: Ambulatory Visit | Attending: Radiation Oncology | Admitting: Radiation Oncology

## 2015-09-15 DIAGNOSIS — Z510 Encounter for antineoplastic radiation therapy: Secondary | ICD-10-CM | POA: Diagnosis not present

## 2015-09-16 ENCOUNTER — Ambulatory Visit
Admission: RE | Admit: 2015-09-16 | Discharge: 2015-09-16 | Disposition: A | Discharge status: Home / Self Care | Payer: Medicaid Other | Source: Ambulatory Visit | Attending: Radiation Oncology | Admitting: Radiation Oncology

## 2015-09-16 ENCOUNTER — Ambulatory Visit: Payer: Medicaid Other

## 2015-09-16 DIAGNOSIS — Z510 Encounter for antineoplastic radiation therapy: Secondary | ICD-10-CM | POA: Diagnosis not present

## 2015-09-17 ENCOUNTER — Ambulatory Visit: Payer: Medicaid Other

## 2015-09-17 ENCOUNTER — Ambulatory Visit
Admission: RE | Admit: 2015-09-17 | Discharge: 2015-09-17 | Disposition: A | Discharge status: Home / Self Care | Payer: Medicaid Other | Source: Ambulatory Visit | Attending: Radiation Oncology | Admitting: Radiation Oncology

## 2015-09-17 DIAGNOSIS — Z510 Encounter for antineoplastic radiation therapy: Secondary | ICD-10-CM | POA: Diagnosis not present

## 2015-09-18 ENCOUNTER — Ambulatory Visit
Admission: RE | Admit: 2015-09-18 | Discharge: 2015-09-18 | Disposition: A | Discharge status: Home / Self Care | Payer: Medicaid Other | Source: Ambulatory Visit | Attending: Radiation Oncology | Admitting: Radiation Oncology

## 2015-09-18 DIAGNOSIS — Z510 Encounter for antineoplastic radiation therapy: Secondary | ICD-10-CM | POA: Diagnosis not present

## 2015-09-21 ENCOUNTER — Encounter: Payer: Self-pay | Admitting: Radiation Oncology

## 2015-09-21 ENCOUNTER — Ambulatory Visit
Admission: RE | Admit: 2015-09-21 | Discharge: 2015-09-21 | Disposition: A | Discharge status: Home / Self Care | Payer: Medicaid Other | Source: Ambulatory Visit | Attending: Radiation Oncology | Admitting: Radiation Oncology

## 2015-09-21 ENCOUNTER — Ambulatory Visit: Payer: Medicaid Other

## 2015-09-21 VITALS — BP 92/60 | HR 97 | Temp 99.7°F | Resp 12 | Wt 110.4 lb

## 2015-09-21 DIAGNOSIS — C44692 Other specified malignant neoplasm of skin of right upper limb, including shoulder: Secondary | ICD-10-CM

## 2015-09-21 DIAGNOSIS — Z510 Encounter for antineoplastic radiation therapy: Secondary | ICD-10-CM | POA: Diagnosis not present

## 2015-09-21 NOTE — Progress Notes (Signed)
PAIN: She rates her pain as a 10 on a scale of 0-10. constant, sharp and burning over posterior neck  SWALLOWING/DIET: Pt denies dysphagia. Reports dry mouth. SKIN: Skin exam reveals Hyperpigmentation, Pruritus and erythema. Pt continues to apply Sonafine as directed. OTHER: Pt complains of fatigue and weakness.   WEIGHT/VS: BP 92/60 mmHg  Pulse 97  Temp(Src) 99.7 F (37.6 C) (Oral)  Resp 12  Wt 110 lb 6.4 oz (50.077 kg)  SpO2 99%  LMP 08/07/2015 Wt Readings from Last 3 Encounters:  09/21/15 110 lb 6.4 oz (50.077 kg)  09/14/15 109 lb (49.442 kg)  09/07/15 107 lb 11.2 oz (48.852 kg)

## 2015-09-21 NOTE — Progress Notes (Signed)
Weekly Management Note:  Site: posterior right shoulder Current Dose:   4600  cGy Projected Dose:  6000  cGy  Narrative: The patient is seen today for routine under treatment assessment. CBCT/MVCT images/port films were reviewed. The chart was reviewed.    She is generally doing well Tom a she continues to have pain along her posterior right neck.  She tells me she will see her primary care physician, Dr. Alyson Ingles,  or a refill of her oxycodone.  She uses Sonafine cream as needed. Physical Examination:  Filed Vitals:   09/21/15 1056  BP: 92/60  Pulse: 97  Temp: 99.7 F (37.6 C)  Resp: 12  .  Weight: 110 lb 6.4 oz (50.077 kg).  There is  Marked hyperpigmentation along her right posterior shoulder. There is hypopigmentation the skin along her previous area of scabs. No areas of moist desquamation.  Impression: Tolerating radiation therapy well.  Plan: Continue radiation therapy as planned.

## 2015-09-22 ENCOUNTER — Ambulatory Visit: Payer: Medicaid Other

## 2015-09-22 ENCOUNTER — Ambulatory Visit
Admission: RE | Admit: 2015-09-22 | Discharge: 2015-09-22 | Disposition: A | Discharge status: Home / Self Care | Payer: Medicaid Other | Source: Ambulatory Visit | Attending: Radiation Oncology | Admitting: Radiation Oncology

## 2015-09-22 DIAGNOSIS — Z510 Encounter for antineoplastic radiation therapy: Secondary | ICD-10-CM | POA: Diagnosis not present

## 2015-09-23 ENCOUNTER — Ambulatory Visit
Admission: RE | Admit: 2015-09-23 | Discharge: 2015-09-23 | Disposition: A | Discharge status: Home / Self Care | Payer: Medicaid Other | Source: Ambulatory Visit | Attending: Radiation Oncology | Admitting: Radiation Oncology

## 2015-09-23 ENCOUNTER — Ambulatory Visit: Payer: Medicaid Other

## 2015-09-23 DIAGNOSIS — Z510 Encounter for antineoplastic radiation therapy: Secondary | ICD-10-CM | POA: Diagnosis not present

## 2015-09-24 ENCOUNTER — Ambulatory Visit: Payer: Medicaid Other

## 2015-09-24 ENCOUNTER — Ambulatory Visit
Admission: RE | Admit: 2015-09-24 | Discharge: 2015-09-24 | Disposition: A | Discharge status: Home / Self Care | Payer: Medicaid Other | Source: Ambulatory Visit | Attending: Radiation Oncology | Admitting: Radiation Oncology

## 2015-09-24 DIAGNOSIS — Z510 Encounter for antineoplastic radiation therapy: Secondary | ICD-10-CM | POA: Diagnosis not present

## 2015-09-25 ENCOUNTER — Ambulatory Visit
Admission: RE | Admit: 2015-09-25 | Discharge: 2015-09-25 | Disposition: A | Discharge status: Home / Self Care | Payer: Medicaid Other | Source: Ambulatory Visit | Attending: Radiation Oncology | Admitting: Radiation Oncology

## 2015-09-25 DIAGNOSIS — Z510 Encounter for antineoplastic radiation therapy: Secondary | ICD-10-CM | POA: Diagnosis not present

## 2015-09-28 ENCOUNTER — Encounter: Payer: Self-pay | Admitting: Radiation Oncology

## 2015-09-28 ENCOUNTER — Ambulatory Visit
Admission: RE | Admit: 2015-09-28 | Discharge: 2015-09-28 | Disposition: A | Discharge status: Home / Self Care | Payer: Medicaid Other | Source: Ambulatory Visit | Attending: Radiation Oncology | Admitting: Radiation Oncology

## 2015-09-28 ENCOUNTER — Ambulatory Visit: Payer: Medicaid Other

## 2015-09-28 VITALS — BP 101/66 | HR 81 | Temp 98.8°F | Ht 63.0 in | Wt 112.2 lb

## 2015-09-28 DIAGNOSIS — C44692 Other specified malignant neoplasm of skin of right upper limb, including shoulder: Secondary | ICD-10-CM | POA: Diagnosis present

## 2015-09-28 NOTE — Progress Notes (Signed)
Weekly Management Note:  Site: Posterior right shoulder Current Dose:  600  cGy Projected Dose: 1000  cGy  Narrative: The patient is seen today for routine under treatment assessment. CBCT/MVCT images/port films were reviewed. The chart was reviewed.   She continues to have right posterior shoulder discomfort with burning/stinging.  Much of these symptoms were present prior to initiation of radiation therapy.  However, she is having more discomfort with development of a moist desquamation.  Physical Examination:  Filed Vitals:   09/28/15 1054  BP: 101/66  Pulse: 81  Temp: 98.8 F (37.1 C)  .  Weight: 112 lb 3.2 oz (50.894 kg).  There is marked hyperpigmentation the skin within her treatment field with central hypopigmentation of the skin as seen previously along with a 1-2 cm area of early moist desquamation centrally.  Impression: Tolerating radiation therapy well except for worsening radiation dermatitis with moist desquamation.  I think it would be appropriate to stop her radiation therapy at this point in time with a cumulative dose of 5600 cGy in 28 sessions.  Plan: She was given Neosporin ointment to use twice a day and also scheduled to see Dr. Sondra Come for a follow-up visit in one month.

## 2015-09-28 NOTE — Progress Notes (Signed)
Gail Tucker has received 28 fractions to her right shoulder blade region.  Note increased hyperpigmentation with peeling in this area. Given Neosporin as approved by Dr. Valere Dross.  She reports that she continues to have stinging and burning at a level 10/10 at this time..  Completes on 09/30/15

## 2015-09-28 NOTE — Progress Notes (Signed)
[facility] End of Treatment Note  Name:Shine Escareno  Date: 09/28/2015 [id number] DOB:[birth year]   Status:outpatient    CC: Ricke Hey, MD  Dr. Erroll Luna  REFERRING PHYSICIAN:   Dr. 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                         BEAMS/ENERGY:  En face 12 MEV electrons with 1.0 cm custom bolus for both initial and reduced fields  NARRATIVE: The patient tolerated treatment reasonably well although she developed a moist desquamation during her last week of therapy and I dropped her last 2 treatments.  She was given Neosporin cream to use twice a day.                        PLAN: Routine followup in one month with Dr. Sondra Come.  Patient instructed to call if questions or worsening complaints in interim.

## 2015-09-29 ENCOUNTER — Ambulatory Visit: Payer: Medicaid Other

## 2015-09-29 ENCOUNTER — Encounter: Payer: Self-pay | Admitting: Radiation Oncology

## 2015-09-30 ENCOUNTER — Ambulatory Visit: Payer: Medicaid Other

## 2015-10-16 ENCOUNTER — Telehealth: Payer: Self-pay | Admitting: *Deleted

## 2015-10-16 NOTE — Telephone Encounter (Signed)
On 10-16-15 fax medical records to disability determination services, it was consult note, follow up note, sim & planning note, end of tx note.

## 2015-11-03 ENCOUNTER — Telehealth: Payer: Self-pay | Admitting: *Deleted

## 2015-11-03 NOTE — Telephone Encounter (Signed)
CALLED PATIENT TO INFORM THAT FU NEEDS TO BE MOVED ON 11-04-15 DUE TO DR. KINARD BEING IN THE OR, MOVED FU APPT. TO 11-11-15 @ 8 AM, LVM FOR PATIENT TO CALL BACK AND VERIFY THAT SHE RECEIVED THIS MESSAGE AND THAT THE DAY AND TIME ARE O.K. FOR HER.

## 2015-11-04 ENCOUNTER — Ambulatory Visit: Admission: RE | Admit: 2015-11-04 | Payer: Medicaid Other | Source: Ambulatory Visit | Admitting: Radiation Oncology

## 2015-11-04 ENCOUNTER — Telehealth: Payer: Self-pay | Admitting: *Deleted

## 2015-11-04 ENCOUNTER — Telehealth: Payer: Self-pay | Admitting: Oncology

## 2015-11-04 ENCOUNTER — Ambulatory Visit
Admission: RE | Admit: 2015-11-04 | Discharge: 2015-11-04 | Disposition: A | Discharge status: Home / Self Care | Payer: Medicaid Other | Source: Ambulatory Visit | Attending: Radiation Oncology | Admitting: Radiation Oncology

## 2015-11-04 ENCOUNTER — Encounter: Payer: Self-pay | Admitting: Radiation Oncology

## 2015-11-04 VITALS — BP 116/84 | HR 79 | Temp 98.3°F | Resp 16 | Ht 63.0 in | Wt 104.6 lb

## 2015-11-04 DIAGNOSIS — C44692 Other specified malignant neoplasm of skin of right upper limb, including shoulder: Secondary | ICD-10-CM

## 2015-11-04 MED ORDER — OXYCODONE-ACETAMINOPHEN 5-325 MG PO TABS: 1.0000 | ORAL_TABLET | Freq: Four times a day (QID) | ORAL | Status: DC | PRN

## 2015-11-04 NOTE — Telephone Encounter (Signed)
CALLED PATIENT TO INFORM OF 3 MONTH FU WITH DR. [place] ON 02-04-16 @ 9:40 AM, SPOKE WITH PATIENT AND SHE IS AWARE OF THIS APPT.

## 2015-11-04 NOTE — Patient Instructions (Signed)
Contact our office if you have any questions following today's appointment: [phone].  

## 2015-11-04 NOTE — Progress Notes (Signed)
Gail Tucker here for follow up.  She reports having pain in her right neck and is rating it at a 8/10.  She said the pain is a little better.  She reports the cold weather makes the pain worse.  She also has tingling in her right shoulder.  She was taking oxycodone 5 mg but does not have any left.  She reports having headaches every other day.  She said this started after radiation.  The skin on her right posterior shoulder has hyperpigmentation and scarring.  She reports it itches and burns.  She is using sonafine.  She reports having a poor appetite and is taking megace.  She reports having fatigue.  BP 116/84 mmHg  Pulse 79  Temp(Src) 98.3 F (36.8 C) (Oral)  Resp 16  Ht 5\' 3"  (1.6 m)  Wt 104 lb 9.6 oz (47.446 kg)  BMI 18.53 kg/m2  SpO2 100%   Wt Readings from Last 3 Encounters:  11/04/15 104 lb 9.6 oz (47.446 kg)  09/28/15 112 lb 3.2 oz (50.894 kg)  09/21/15 110 lb 6.4 oz (50.077 kg)

## 2015-11-04 NOTE — Progress Notes (Signed)
Radiation Oncology         [phone]) [phone] ________________________________  Name: Audreena Bier MRN: [id number]  Date: 11/04/2015  DOB: Jun 22, 1976  Follow-Up Visit Note  CC: Ricke Hey, MD  Erroll Luna, MD    ICD-9-CM ICD-10-CM   1. 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 She denies any weeping or bleeding of her skin. She states that she continues to use sonafine cream and finds that she has hyperpigmentation and some areas of hypopigmentation and others with scarring. She discusses that the tissue itches and burns. She has been headaches mostly during the course of her menstrual cycle and the last was about 2 weeks ago when she experienced this but she has ongoing headaches intermittently as well mostly in the morning described as a dull throbbing aching sensation in her right temple area. She states that she has a history of migraine headaches but is never been evaluated by neurology. She also describes pain in her neck that radiates up into the base of the right scapula. She describes this as sometimes it aching sensation but oftentimes is a sharp shooting pain. She's been using oxycodone for this and requested a refill. She feels that the pain is slightly improved with the oxycodone.                      ALLERGIES:  is allergic to tetracyclines & related.  Meds: Current Outpatient Prescriptions  Medication Sig Dispense Refill  . megestrol (MEGACE) 40 MG/ML suspension Take 200 mg by mouth 2 (two) times daily.     . naproxen sodium (ANAPROX) 220 MG  tablet Take 220 mg by mouth 2 (two) times daily with a meal.    . esomeprazole (NEXIUM) 20 MG capsule Take 20 mg by mouth daily as needed (indigestion.). Reported on 11/04/2015    . oxyCODONE (OXY IR/ROXICODONE) 5 MG immediate release tablet Take 2 tablets (10 mg total) by mouth every 6 (six) hours as needed for severe pain. (Patient not taking: Reported on 11/04/2015) 30 tablet 0  . oxyCODONE-acetaminophen (ROXICET) 5-325 MG tablet Take 1 tablet by mouth every 6 (six) hours as needed for severe pain. 30 tablet 0   No current facility-administered medications for this encounter.    Physical Findings:  height is 5\' 3"  (1.6 m) and weight is 104 lb 9.6 oz (47.446 kg). Her oral temperature is 98.3 F (36.8 C). Her blood pressure is 116/84 and her pulse is 79. Her respiration is 16 and oxygen saturation is 100%.   Pain scale is an 8 out of 10. In general this is a pleasant African-American female in no acute distress she is alert and oriented times for appropriate during her examination. The right base of the neck and shoulder into the scapular region are evaluated. Hypopigmentation as well as hyperpigmentation is noted in a rectangular shaped field consistent with her previous radiation therapy. No evidence of desquamation is identified at this time and the skin is epithelialized without an any evidence of breakdown. The patient has complete range of motion of her right upper extremity particularly of her shoulder, and has reproducible pain  with testing motor strength. She has 3+ strength of the right upper extremity and 4+ of the left.   Lab Findings: Lab Results  Component Value Date   WBC 6.8 08/28/2015   HGB 13.6 08/28/2015   HCT 39.9 08/28/2015   MCV 98.3 08/28/2015   PLT 146* 08/28/2015    Radiographic Findings: No results found.  Impression:    Plan:  The patient is recovering from the effects of radiation. With regard to her pain I'm concerned that there may be a neuropathic component  to this and in addition that she is having a least headaches, I have recommended that we refer her to neurology for further assessment and management of her pain. In the interim until she can get an appointment a prescription for oxycodone/apap 5/325 was provided to the patient #30 no refills to be taken 1 tablet every 4-6 hours as needed for pain. We will ask her to return in 3 months time for continued evaluation and I will discuss her case with Dr. Sondra Come to determine if he feels that she needs additional imaging routinely as she will also be followed by surgery.   Carola Rhine, PAC

## 2015-11-04 NOTE — Telephone Encounter (Signed)
Called [name] and notified her that imaging will not be ordered at this time and verified that she was given a 3 month follow up appointment with Dr. Sondra Come.

## 2015-11-11 ENCOUNTER — Ambulatory Visit: Payer: Medicaid Other | Admitting: Radiation Oncology

## 2015-11-18 ENCOUNTER — Encounter: Payer: Self-pay | Admitting: Neurology

## 2015-11-18 ENCOUNTER — Ambulatory Visit (INDEPENDENT_AMBULATORY_CARE_PROVIDER_SITE_OTHER): Payer: Medicaid Other | Admitting: Neurology

## 2015-11-18 VITALS — BP 133/86 | HR 95 | Ht 63.0 in | Wt 107.5 lb

## 2015-11-18 DIAGNOSIS — G43019 Migraine without aura, intractable, without status migrainosus: Secondary | ICD-10-CM

## 2015-11-18 DIAGNOSIS — R202 Paresthesia of skin: Secondary | ICD-10-CM | POA: Diagnosis not present

## 2015-11-18 HISTORY — DX: Migraine without aura, intractable, without status migrainosus: G43.019

## 2015-11-18 MED ORDER — GABAPENTIN 100 MG PO CAPS: ORAL_CAPSULE | ORAL | Status: DC

## 2015-11-18 NOTE — Patient Instructions (Addendum)
   Neurontin (gabapentin) may result in drowsiness, ankle swelling, gait instability, or possibly dizziness. Please contact our office if significant side effects occur with this medication.  Migraine Headache A migraine headache is an intense, throbbing pain on one or both sides of your head. A migraine can last for 30 minutes to several hours. CAUSES  The exact cause of a migraine headache is not always known. However, a migraine may be caused when nerves in the brain become irritated and release chemicals that cause inflammation. This causes pain. Certain things may also trigger migraines, such as:  Alcohol.  Smoking.  Stress.  Menstruation.  Aged cheeses.  Foods or drinks that contain nitrates, glutamate, aspartame, or tyramine.  Lack of sleep.  Chocolate.  Caffeine.  Hunger.  Physical exertion.  Fatigue.  Medicines used to treat chest pain (nitroglycerine), birth control pills, estrogen, and some blood pressure medicines. SIGNS AND SYMPTOMS  Pain on one or both sides of your head.  Pulsating or throbbing pain.  Severe pain that prevents daily activities.  Pain that is aggravated by any physical activity.  Nausea, vomiting, or both.  Dizziness.  Pain with exposure to bright lights, loud noises, or activity.  General sensitivity to bright lights, loud noises, or smells. Before you get a migraine, you may get warning signs that a migraine is coming (aura). An aura may include:  Seeing flashing lights.  Seeing bright spots, halos, or zigzag lines.  Having tunnel vision or blurred vision.  Having feelings of numbness or tingling.  Having trouble talking.  Having muscle weakness. DIAGNOSIS  A migraine headache is often diagnosed based on:  Symptoms.  Physical exam.  A CT scan or MRI of your head. These imaging tests cannot diagnose migraines, but they can help rule out other causes of headaches. TREATMENT Medicines may be given for pain and  nausea. Medicines can also be given to help prevent recurrent migraines.  HOME CARE INSTRUCTIONS  Only take over-the-counter or prescription medicines for pain or discomfort as directed by your health care provider. The use of long-term narcotics is not recommended.  Lie down in a dark, quiet room when you have a migraine.  Keep a journal to find out what may trigger your migraine headaches. For example, write down:  What you eat and drink.  How much sleep you get.  Any change to your diet or medicines.  Limit alcohol consumption.  Quit smoking if you smoke.  Get 7-9 hours of sleep, or as recommended by your health care provider.  Limit stress.  Keep lights dim if bright lights bother you and make your migraines worse. SEEK IMMEDIATE MEDICAL CARE IF:   Your migraine becomes severe.  You have a fever.  You have a stiff neck.  You have vision loss.  You have muscular weakness or loss of muscle control.  You start losing your balance or have trouble walking.  You feel faint or pass out.  You have severe symptoms that are different from your first symptoms. MAKE SURE YOU:   Understand these instructions.  Will watch your condition.  Will get help right away if you are not doing well or get worse.   This information is not intended to replace advice given to you by your health care provider. Make sure you discuss any questions you have with your health care provider.   Document Released: 09/26/2005 Document Revised: 10/17/2014 Document Reviewed: 06/03/2013 Elsevier Interactive Patient Education [employer].

## 2015-11-18 NOTE — Progress Notes (Addendum)
Reason for visit: Headache, right arm and shoulder discomfort  Referring physician: Dr. Scotty Court is a 40 y.o. female  History of present illness:  Ms. Crees is a 40 year old right-handed black female with a history of a dermatofibrosarcoma protuberans on her right shoulder. The patient has had the lesion for 10 or 11 years, with multiple resections. The patient has had some issues with some tingling and numb sensations into the upper arm on the right and some discomfort in the right shoulder since May 2016. 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 She eventually started radiation therapy in November 2016. The radiation therapy is now complete. The patient has had some ongoing discomfort in the right shoulder, pain in the right neck, and headaches that are throughout her head. The patient has a long-standing history of migraine headaches that have been very severe during her lifetime beginning at age 84. The patient has had daily headaches for least 6 months. The patient reports nausea and vomiting and photophobia with the headache and some sensitivity to weather changes and to certain odors. She is on oxycodone for discomfort. She reports some tingling sensations into the upper arm on the right, occasionally she will have her fingers and toes, "lock up" on her. The patient denies any problems with balance or difficulty controlling the bowels or the bladder. There are plans to get MRI evaluation of the shoulder in April 2017. The patient indicates that 2 of her sons also have migraine headache. She is sent to this office for an evaluation.  Past Medical History  Diagnosis Date  . GERD (gastroesophageal reflux disease)     occasional  . Wears partial dentures     lower  . Wears dentures     upper  . Migraines     occasional  . Cancer ([place]) 06/2015    right  shoulder dermatofibrosarcoma protiberans  . Common migraine with  intractable migraine 11/18/2015    Past Surgical History  Procedure Laterality Date  . Dilation and curettage of uterus    . Tubal ligation  09/25/2005  . Multiple tooth extractions    . Dilation and evacuation  06/12/2001  . Lipoma excision Right 05/26/2015    Procedure: EXCISION CYST ON RIGHT SHOULDER;  Surgeon: Erroll Luna, MD;  Location: [facility];  Service: General;  Laterality: Right;    Family History  Problem Relation Age of Onset  . HIV/AIDS Father     Social history:  reports that she has been smoking.  She has never used smokeless tobacco. She reports that she does not drink alcohol or use illicit drugs.  Medications:  Prior to Admission medications   Medication Sig Start Date End Date Taking? Authorizing Provider  esomeprazole (NEXIUM) 20 MG capsule Take 20 mg by mouth daily as needed (indigestion.). Reported on 11/18/2015   Yes Historical Provider, MD  megestrol (MEGACE) 40 MG/ML suspension Take 200 mg by mouth 2 (two) times daily.    Yes Historical Provider, MD  naproxen sodium (ANAPROX) 220 MG tablet Take 220 mg by mouth 2 (two) times daily with a meal.   Yes Historical Provider, MD  oxyCODONE (OXY IR/ROXICODONE) 5 MG immediate release tablet Take 2 tablets (10 mg total) by mouth every 6 (six) hours as needed for severe pain. 08/13/15  Yes Charlesetta Shanks, MD  oxyCODONE-acetaminophen (ROXICET) 5-325 MG tablet Take 1 tablet by mouth every 6 (six) hours as needed for  severe pain. 11/04/15  Yes Hayden Pedro, PA-C      Allergies  Allergen Reactions  . Tetracyclines & Related Hives    ROS:  Out of a complete 14 system review of symptoms, the patient complains only of the following symptoms, and all other reviewed systems are negative.  Weight loss Itching Shortness of breath Muscle cramps, aching muscles Skin sensitivity Headache, numbness Depression, not enough sleep, change in appetite, disinterest in activities Sleepiness  Blood pressure  133/86, pulse 95, height 5\' 3"  (1.6 m), weight 107 lb 8 oz (48.762 kg).  Physical Exam  General: The patient is alert and cooperative at the time of the examination.  Eyes: Pupils are equal, round, and reactive to light. Discs are flat bilaterally.  Neck: The neck is supple, no carotid bruits are noted.  Respiratory: The respiratory examination is clear.  Cardiovascular: The cardiovascular examination reveals a regular rate and rhythm, no obvious murmurs or rubs are noted.  Neuromuscular: The patient lacks about 25 of lateral rotation of the cervical spine to the right, fairly normal range of movement of the neck to the left.  Skin: Extremities are without significant edema.  Neurologic Exam  Mental status: The patient is alert and oriented x 3 at the time of the examination. The patient has apparent normal recent and remote memory, with an apparently normal attention span and concentration ability.  Cranial nerves: Facial symmetry is present. There is good sensation of the face to pinprick and soft touch bilaterally. The strength of the facial muscles and the muscles to head turning and shoulder shrug are normal bilaterally. Speech is well enunciated, no aphasia or dysarthria is noted. Extraocular movements are full. Visual fields are full. The tongue is midline, and the patient has symmetric elevation of the soft palate. No obvious hearing deficits are noted.  Motor: The motor testing reveals 5 over 5 strength of all 4 extremities. Good symmetric motor tone is noted throughout.  Sensory: Sensory testing is intact to pinprick, soft touch, vibration sensation, and position sense on all 4 extremities, with exception of some decrease in pinprick sensation upper arm on the right. No evidence of extinction is noted.  Coordination: Cerebellar testing reveals good finger-nose-finger and heel-to-shin bilaterally.  Gait and station: Gait is normal. Tandem gait is normal. Romberg is negative.  No drift is seen.  Reflexes: Deep tendon reflexes are symmetric and normal bilaterally. Toes are downgoing bilaterally.   Assessment/Plan:  1. Intractable migraine headache  2. 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 She has a pre-existing history of migraine that has been daily in the past, but the headaches have worsened within the last several months. The patient reports some neck and shoulder discomfort associated with the radiation therapy and surgeries that she has had on the right shoulder. She reports some paresthesias into the upper arm on the right. The patient may be at risk for radiation-induced brachial plexopathy, but the symptoms predated the onset of radiation. The patient has no weakness of the upper extremities. The patient will be placed on gabapentin therapy, and she will be set up for neuromuscular therapy on the shoulder and neck. She will follow-up in 3 months. MRI of the shoulder will be done in the near future. We may consider EMG and nerve conduction study evaluation at some point in the future if her symptoms persist.  Jill Alexanders MD 11/18/2015 6:03 PM  [facility] [address] [place], [place] [address]  Phone [phone] Fax [phone]

## 2015-11-24 ENCOUNTER — Ambulatory Visit: Payer: Medicaid Other | Attending: Neurology | Admitting: Rehabilitative and Restorative Service Providers"

## 2015-11-26 ENCOUNTER — Encounter: Payer: Self-pay | Admitting: Radiation Oncology

## 2015-11-26 ENCOUNTER — Telehealth: Payer: Self-pay | Admitting: Neurology

## 2015-11-26 MED ORDER — TOPIRAMATE 25 MG PO TABS
ORAL_TABLET | ORAL | Status: DC
Start: 2015-11-26 — End: 2016-01-05

## 2015-11-26 NOTE — Progress Notes (Signed)
Paperwork (HUD) received given to RN, 11/26/15 Ardeen Fillers)

## 2015-11-26 NOTE — Telephone Encounter (Signed)
Patient is calling. She started taking gabapentin (NEURONTIN) 100 MG capsule last week. She states medication is not helping with headaches and it is leaving a very bad metallic taste in her mouth.She is also having extreme pain in her neck and right shoulder area and is not sure if this could be coming from the side effects of the radiation she has taken. Please call and discuss.

## 2015-11-26 NOTE — Progress Notes (Signed)
Patient bought in disability forms. I took them to [facility]--dr. Sondra Come

## 2015-11-26 NOTE — Telephone Encounter (Signed)
I called the patient. The patient indicates that she is having a metallic taste in the mouth associated with the gabapentin. I have never heard this side effect, but the patient cannot tolerate the drug. We will stop the medication. I'll give a trial on Topamax.

## 2015-12-08 ENCOUNTER — Telehealth: Payer: Self-pay | Admitting: *Deleted

## 2015-12-08 NOTE — Telephone Encounter (Signed)
CALLED  PATIENT TO INFORM OF FU FOR 12-10-15 2 11:20 AM, REGARDING PAPERWORK, PATIENT AGREED TO COME

## 2015-12-10 ENCOUNTER — Encounter: Payer: Self-pay | Admitting: Oncology

## 2015-12-10 ENCOUNTER — Telehealth: Payer: Self-pay | Admitting: Radiation Oncology

## 2015-12-10 ENCOUNTER — Encounter: Payer: Self-pay | Admitting: Radiation Oncology

## 2015-12-10 ENCOUNTER — Ambulatory Visit
Admission: RE | Admit: 2015-12-10 | Discharge: 2015-12-10 | Disposition: A | Discharge status: Home / Self Care | Payer: Medicaid Other | Source: Ambulatory Visit | Attending: Radiation Oncology | Admitting: Radiation Oncology

## 2015-12-10 VITALS — BP 129/86 | HR 73 | Temp 98.5°F | Resp 16 | Ht 63.0 in | Wt 108.1 lb

## 2015-12-10 DIAGNOSIS — C44692 Other specified malignant neoplasm of skin of right upper limb, including shoulder: Secondary | ICD-10-CM

## 2015-12-10 HISTORY — DX: Reserved for concepts with insufficient information to code with codable children: IMO0002

## 2015-12-10 HISTORY — DX: Reserved for inherently not codable concepts without codable children: IMO0001

## 2015-12-10 NOTE — Telephone Encounter (Signed)
Patient will come and p/u paperwork from Mrs. Gail Tucker 12/11/15

## 2015-12-10 NOTE — Progress Notes (Signed)
Paperwork received from doctor, i did not need to send out for pt. She will come and pick up her copy, 12/10/15 Gail Tucker)

## 2015-12-10 NOTE — Progress Notes (Signed)
Gail Tucker is here for follow up.  She reports pain in her right shoulder at a 4/10.  She is taking oxycodone 10 mg as needed.  She was put on gabapentin and topamax by her neurologist.  She does not like how they made her feel so she stopped taking them.  She does have burning in her right arm occasionally that lasts for about 30 minutes.  The skin on her right upper back has hyperpigmentation and scarring.  She reports have a poor appetite and says that she does not sleep a lot.  BP 129/86 mmHg  Pulse 73  Temp(Src) 98.5 F (36.9 C) (Oral)  Resp 16  Ht 5\' 3"  (1.6 m)  Wt 108 lb 1.6 oz (49.034 kg)  BMI 19.15 kg/m2  SpO2 100%   Wt Readings from Last 3 Encounters:  12/10/15 108 lb 1.6 oz (49.034 kg)  11/18/15 107 lb 8 oz (48.762 kg)  11/04/15 104 lb 9.6 oz (47.446 kg)

## 2015-12-10 NOTE — Progress Notes (Signed)
Radiation Oncology         [phone]) [phone] ________________________________  Name: Gail Tucker MRN: [id number]  Date: 12/10/2015  DOB: [birth year]  Follow-Up Visit Note  CC: Gail Tucker, Gail Tucker  Gail Tucker, Gail Tucker    ICD-9-CM ICD-10-CM   1. 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 Valere Dross, 56 Gy  Narrative:  The patient returns today for routine follow-up.  She reports pain in her right shoulder at a 4/10. She is taking oxycodone 10 mg as needed. She was put on gabapentin and topamax by her neurologist. She does not like how they made her feel so she stopped taking them. She does have burning in her right arm occasionally that lasts for about 30 minutes. The skin on her right upper back has hyperpigmentation and scarring. She reports have a poor appetite and says that she does not sleep a lot.  She reports some tingling/numbness in her right hand but no weakness.                          ALLERGIES:  is allergic to tetracyclines & related.  Meds: Current Outpatient Prescriptions  Medication Sig Dispense Refill  . megestrol (MEGACE) 40 MG/ML suspension Take 200 mg by mouth 2 (two) times daily.     Marland Kitchen oxyCODONE (OXY IR/ROXICODONE) 5 MG immediate release tablet Take 2 tablets (10 mg total) by mouth every 6 (six) hours as needed for severe pain. 30 tablet 0  . esomeprazole (NEXIUM) 20 MG capsule Take 20 mg by mouth daily as needed (indigestion.). Reported on 12/10/2015    . gabapentin (NEURONTIN) 100 MG capsule Reported on 12/10/2015  1  . naproxen sodium (ANAPROX) 220 MG tablet Take 220 mg by mouth 2 (two) times daily with a meal. Reported on 12/10/2015    . topiramate (TOPAMAX) 25 MG tablet Take one tablet at night for one week, then take 2 tablets at night (Patient not taking: Reported on 12/10/2015) 60 tablet 1   No  current facility-administered medications for this encounter.    Physical Findings: The patient is in no acute distress. Patient is alert and oriented.  height is 5\' 3"  (1.6 m) and weight is 108 lb 1.6 oz (49.034 kg). Her oral temperature is 98.5 F (36.9 C). Her blood pressure is 129/86 and her pulse is 73. Her respiration is 16 and oxygen saturation is 100%. .  No significant changes. Right upper back she has surgical changes and areas of hyperpigmentation/ hypopigmentation in the treatment area. No obvious signs of recurrence. No palpable cervical, supraclavicular or axillary lymphoadenopathy. The heart has a regular rate and rhythm. The lungs are clear to auscultation.    Lab Findings: Lab Results  Component Value Date   WBC 6.8 08/28/2015   HGB 13.6 08/28/2015   HCT 39.9 08/28/2015   MCV 98.3 08/28/2015   PLT 146* 08/28/2015    Radiographic Findings: No results found.  Impression:  The patient is recovering from the effects of radiation.  No evidence of recurrence on clinical exam today. She is having paresthesias  involving her right arm and hand. She will undergo MRI for further workup of this issue.  Plan:  Follow up with her neurologist. Follow up with radiation oncology in May.  -----------------------------------  Blair Promise, PhD, Gail Tucker  This document serves as a  record of services personally performed by Gery Pray, Gail Tucker. It was created on his behalf by Derek Mound, a trained medical scribe. The creation of this record is based on the scribe's personal observations and the provider's statements to them. This document has been checked and approved by the attending provider.

## 2016-01-05 ENCOUNTER — Telehealth: Payer: Self-pay | Admitting: Neurology

## 2016-01-05 ENCOUNTER — Telehealth: Payer: Self-pay | Admitting: Radiation Oncology

## 2016-01-05 MED ORDER — PROPRANOLOL HCL 20 MG PO TABS: 20.0000 mg | ORAL_TABLET | Freq: Two times a day (BID) | ORAL | Status: AC

## 2016-01-05 NOTE — Telephone Encounter (Signed)
Patient is questioning when/or why she needs an MRI, and why is she seeing Dr Jannifer Franklin, information was forwarded to Dr Sondra Come and Theressa Millard.A.

## 2016-01-05 NOTE — Telephone Encounter (Signed)
Patient is calling and states that she is having a lot of side effects with the Rx topiramate 25 mg tablets:  She is confused, mood swings, and has numbness in her right arm.  Also, Dr. Sondra Come is asking her if she is having an MRI before coming back to him so she is asking if Dr. Jannifer Franklin is the one who will order that for her.  Please call.

## 2016-01-05 NOTE — Telephone Encounter (Signed)
I called the patient. The Topamax seemed to work for the headache but it made her somewhat confused. She will go down to 1 tablet at night per week and then stop it. I will start her on propranolol taking 20 mg twice daily.

## 2016-01-27 ENCOUNTER — Emergency Department (HOSPITAL_COMMUNITY): Payer: Medicaid Other

## 2016-01-27 ENCOUNTER — Emergency Department (HOSPITAL_COMMUNITY)
Admission: EM | Admit: 2016-01-27 | Discharge: 2016-01-27 | Disposition: A | Discharge status: Home / Self Care | Payer: Medicaid Other | Attending: Emergency Medicine | Admitting: Emergency Medicine

## 2016-01-27 ENCOUNTER — Encounter (HOSPITAL_COMMUNITY): Payer: Self-pay | Admitting: Emergency Medicine

## 2016-01-27 DIAGNOSIS — Z98811 Dental restoration status: Secondary | ICD-10-CM | POA: Diagnosis not present

## 2016-01-27 DIAGNOSIS — K219 Gastro-esophageal reflux disease without esophagitis: Secondary | ICD-10-CM | POA: Insufficient documentation

## 2016-01-27 DIAGNOSIS — R2242 Localized swelling, mass and lump, left lower limb: Secondary | ICD-10-CM | POA: Diagnosis present

## 2016-01-27 DIAGNOSIS — Z8679 Personal history of other diseases of the circulatory system: Secondary | ICD-10-CM | POA: Diagnosis not present

## 2016-01-27 DIAGNOSIS — M25462 Effusion, left knee: Secondary | ICD-10-CM | POA: Diagnosis not present

## 2016-01-27 DIAGNOSIS — F17200 Nicotine dependence, unspecified, uncomplicated: Secondary | ICD-10-CM | POA: Insufficient documentation

## 2016-01-27 DIAGNOSIS — M25562 Pain in left knee: Secondary | ICD-10-CM | POA: Insufficient documentation

## 2016-01-27 DIAGNOSIS — Z793 Long term (current) use of hormonal contraceptives: Secondary | ICD-10-CM | POA: Insufficient documentation

## 2016-01-27 DIAGNOSIS — G8929 Other chronic pain: Secondary | ICD-10-CM | POA: Insufficient documentation

## 2016-01-27 DIAGNOSIS — Z923 Personal history of irradiation: Secondary | ICD-10-CM | POA: Insufficient documentation

## 2016-01-27 DIAGNOSIS — Z79899 Other long term (current) drug therapy: Secondary | ICD-10-CM | POA: Insufficient documentation

## 2016-01-27 DIAGNOSIS — Z85828 Personal history of other malignant neoplasm of skin: Secondary | ICD-10-CM | POA: Diagnosis not present

## 2016-01-27 MED ORDER — HYDROMORPHONE HCL 2 MG/ML IJ SOLN
2.0000 mg | Freq: Once | INTRAMUSCULAR | Status: AC
  Administered 2016-01-27: 2 mg via INTRAMUSCULAR
  Filled 2016-01-27: qty 1

## 2016-01-27 MED ORDER — PREDNISONE 20 MG PO TABS: 40.0000 mg | ORAL_TABLET | Freq: Every day | ORAL | Status: DC

## 2016-01-27 MED ORDER — PREDNISONE 20 MG PO TABS
60.0000 mg | ORAL_TABLET | Freq: Once | ORAL | Status: AC
  Administered 2016-01-27: 60 mg via ORAL
  Filled 2016-01-27: qty 3

## 2016-01-27 NOTE — ED Provider Notes (Signed)
History  By signing my name below, I, [name], attest that this documentation has been prepared under the direction and in the presence of Delsa Grana, PA-C. Electronically Signed: [name], ED Scribe. 01/27/2016. 1:41 PM.  Chief Complaint  Patient presents with  . Joint Swelling    left knee   The history is provided by the patient and medical records. No language interpreter was used.    HPI Comments:  Gail Tucker is a 40 y.o. female who presents to the Emergency Department complaining of left knee pain that began four days ago. She reports associated swelling of the left knee. Pt also reports chronic right shoulder pain from dermatofibrosarcoma. She describes it as sharp. She reports some warmth and redness of the left knee that resolved a couple days ago. Pt reports limited ROM of the left knee due to pain. She has taken Roxicodone 10 mg for pain that she takes four times daily. She also reports taking Advil PM. She has not taken anything for pain today. Walking increases the pain. She denies alleviating factors. She denies fever, chills, nausea, vomiting, numbness, tingling or weakness of the LLE. She denies trauma, injury or fall. Pt states she has an appt with the cancer center next month for follow up. She states she is going to start pain management soon.  Past Medical History  Diagnosis Date  . GERD (gastroesophageal reflux disease)     occasional  . Wears partial dentures     lower  . Wears dentures     upper  . Migraines     occasional  . Cancer ([place]) 06/2015    right  shoulder dermatofibrosarcoma protiberans  . Common migraine with intractable migraine 11/18/2015  . Radiation 08/17/15-09/28/15    right posterior shoulder 5000 cGy with reduced field boost of 600 cG y   Past Surgical History  Procedure Laterality Date  . Dilation and curettage of uterus    . Tubal ligation  09/25/2005  . Multiple tooth extractions    . Dilation and evacuation  06/12/2001   . Lipoma excision Right 05/26/2015    Procedure: EXCISION CYST ON RIGHT SHOULDER;  Surgeon: Erroll Luna, MD;  Location: [facility];  Service: General;  Laterality: Right;   Family History  Problem Relation Age of Onset  . HIV/AIDS Father    Social History  Substance Use Topics  . Smoking status: Current Every Day Smoker -- 0.25 packs/day for 10 years    Last Attempt to Quit: 06/21/2015  . Smokeless tobacco: Never Used  . Alcohol Use: No   OB History    No data available     Review of Systems  Musculoskeletal: Positive for joint swelling and arthralgias.  All other systems reviewed and are negative.   Allergies  Tetracyclines & related  Home Medications   Prior to Admission medications   Medication Sig Start Date End Date Taking? Authorizing Provider  esomeprazole (NEXIUM) 20 MG capsule Take 20 mg by mouth daily as needed (indigestion.). Reported on 12/10/2015    Historical Provider, MD  megestrol (MEGACE) 40 MG/ML suspension Take 200 mg by mouth 2 (two) times daily.     Historical Provider, MD  naproxen sodium (ANAPROX) 220 MG tablet Take 220 mg by mouth 2 (two) times daily with a meal. Reported on 12/10/2015    Historical Provider, MD  oxyCODONE (OXY IR/ROXICODONE) 5 MG immediate release tablet Take 2 tablets (10 mg total) by mouth every 6 (six) hours as needed for severe  pain. 08/13/15   Charlesetta Shanks, MD  predniSONE (DELTASONE) 20 MG tablet Take 2 tablets (40 mg total) by mouth daily. 01/27/16   Delsa Grana, PA-C  propranolol (INDERAL) 20 MG tablet Take 1 tablet (20 mg total) by mouth 2 (two) times daily. 01/05/16   Kathrynn Ducking, MD   Triage Vitals: BP 101/72 mmHg  Pulse 99  Temp(Src) 98.1 F (36.7 C) (Oral)  Resp 18  SpO2 100% Physical Exam  Constitutional: She is oriented to person, place, and time. She appears well-developed and well-nourished.  HENT:  Head: Normocephalic and atraumatic.  Eyes: EOM are normal.  Neck: Normal range of motion.   Cardiovascular: Normal rate.   Pulmonary/Chest: Effort normal.  Musculoskeletal:       Left knee: She exhibits decreased range of motion, swelling and effusion. She exhibits no deformity, no erythema, no bony tenderness, normal meniscus and no MCL laxity. Tenderness found.  No warmth of left knee. Right shoulder with large scar over right trapezius and scapula.  Neurological: She is alert and oriented to person, place, and time.  Skin: Skin is warm and dry.  Psychiatric: She has a normal mood and affect. Her behavior is normal.  Nursing note and vitals reviewed.   ED Course  Procedures (including critical care time) DIAGNOSTIC STUDIES: Oxygen Saturation is 100% on RA, normal by my interpretation.   COORDINATION OF CARE: 12:58 PM- Will order Dilaudid injection for pain and X-Ray of left knee. Pt verbalizes understanding and agrees to plan.  Medications  HYDROmorphone (DILAUDID) injection 2 mg (2 mg Intramuscular Given 01/27/16 1303)  predniSONE (DELTASONE) tablet 60 mg (60 mg Oral Given 01/27/16 1411)   Labs Review Labs Reviewed - No data to display  Imaging Review Dg Knee Complete 4 Views Left  01/27/2016  CLINICAL DATA:  Pain and swelling involving the left knee. No known injury. 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 EXAM: LEFT KNEE - COMPLETE 4+ VIEW COMPARISON:  None. FINDINGS: The joint spaces are maintained. No acute fracture, degenerative changes, osteochondral lesion or abnormal soft tissue calcifications. Very small joint effusion. IMPRESSION: No acute bony findings or degenerative changes. Small joint effusion. Electronically Signed   By: Marijo Sanes M.D.   On: 01/27/2016 13:26   I have personally reviewed and evaluated these images and lab results as part of my medical decision-making.   EKG Interpretation None      MDM   Left knee swelling w/o injury.  Pt with mild swelling to the joint spaces, knee swelling, tightness in the knee, and mildly restricted range  of motion. Pt unable to perform full flexion of the knee.  Pt is without systemic symptoms, erythema or redness of the joint consistent with gout or septic joint.  Patient X-Ray negative for obvious fracture or dislocation. Pain difficult to manage in ED, she is on chronic pain medications prescribed by her PCP. Pt advised to follow up with orthopedics if symptoms persist for further evaluation and treatment. Patient given brace while in ED, conservative therapy recommended and discussed. Patient will be dc home & is agreeable with above plan.    Final diagnoses:  Knee joint effusion, left   I personally performed the services described in this documentation, which was scribed in my presence. The recorded information has been reviewed and is accurate.       Delsa Grana, PA-C 01/31/16 [place], MD 02/03/16 0700

## 2016-01-27 NOTE — ED Notes (Addendum)
Pt states left knee pain x 4 days with mild swelling. No injury, no hx of gout. Also c/o chronic rt shoulder pain from known/monitored tumor

## 2016-01-27 NOTE — Discharge Instructions (Signed)
Knee Effusion °Knee effusion means that you have excess fluid in your knee joint. This can cause pain and swelling in your knee. This may make your knee more difficult to bend and move. That is because there is increased pain and pressure in the joint. If there is fluid in your knee, it often means that something is wrong inside your knee, such as severe arthritis, abnormal inflammation, or an infection. Another common cause of knee effusion is an injury to the knee muscles, ligaments, or cartilage. °HOME CARE INSTRUCTIONS °· Use crutches as directed by your health care provider. °· Wear a knee brace as directed by your health care provider. °· Apply ice to the swollen area: °¨ Put ice in a plastic bag. °¨ Place a towel between your skin and the bag. °¨ Leave the ice on for 20 minutes, 2-3 times per day. °· Keep your knee raised (elevated) when you are sitting or lying down. °· Take medicines only as directed by your health care provider. °· Do any rehabilitation or strengthening exercises as directed by your health care provider. °· Rest your knee as directed by your health care provider. You may start doing your normal activities again when your health care provider approves.    °· Keep all follow-up visits as directed by your health care provider. This is important. °SEEK MEDICAL CARE IF: °· You have ongoing (persistent) pain in your knee. °SEEK IMMEDIATE MEDICAL CARE IF: °· You have increased swelling or redness of your knee. °· You have severe pain in your knee. °· You have a fever. °  °This information is not intended to replace advice given to you by your health care provider. Make sure you discuss any questions you have with your health care provider. °  °Document Released: 12/17/2003 Document Revised: 10/17/2014 Document Reviewed: 05/12/2014 °Elsevier Interactive Patient Education ©2016 [employer] Inc[employer] ° °

## 2016-01-27 NOTE — ED Notes (Signed)
PA at the bedside.

## 2016-02-04 ENCOUNTER — Ambulatory Visit: Payer: Self-pay | Admitting: Radiation Oncology

## 2016-02-16 ENCOUNTER — Emergency Department (HOSPITAL_COMMUNITY)
Admission: EM | Admit: 2016-02-16 | Discharge: 2016-02-16 | Disposition: A | Discharge status: Home / Self Care | Payer: Medicaid Other | Attending: Emergency Medicine | Admitting: Emergency Medicine

## 2016-02-16 ENCOUNTER — Emergency Department (HOSPITAL_COMMUNITY): Payer: Medicaid Other

## 2016-02-16 ENCOUNTER — Encounter (HOSPITAL_COMMUNITY): Payer: Self-pay | Admitting: Emergency Medicine

## 2016-02-16 DIAGNOSIS — Z85831 Personal history of malignant neoplasm of soft tissue: Secondary | ICD-10-CM | POA: Diagnosis not present

## 2016-02-16 DIAGNOSIS — K219 Gastro-esophageal reflux disease without esophagitis: Secondary | ICD-10-CM | POA: Insufficient documentation

## 2016-02-16 DIAGNOSIS — Z923 Personal history of irradiation: Secondary | ICD-10-CM | POA: Insufficient documentation

## 2016-02-16 DIAGNOSIS — R6889 Other general symptoms and signs: Secondary | ICD-10-CM

## 2016-02-16 DIAGNOSIS — R509 Fever, unspecified: Secondary | ICD-10-CM | POA: Diagnosis not present

## 2016-02-16 DIAGNOSIS — R197 Diarrhea, unspecified: Secondary | ICD-10-CM | POA: Insufficient documentation

## 2016-02-16 DIAGNOSIS — R51 Headache: Secondary | ICD-10-CM | POA: Diagnosis not present

## 2016-02-16 DIAGNOSIS — F17200 Nicotine dependence, unspecified, uncomplicated: Secondary | ICD-10-CM | POA: Insufficient documentation

## 2016-02-16 DIAGNOSIS — Z79899 Other long term (current) drug therapy: Secondary | ICD-10-CM | POA: Insufficient documentation

## 2016-02-16 DIAGNOSIS — R52 Pain, unspecified: Secondary | ICD-10-CM | POA: Diagnosis present

## 2016-02-16 DIAGNOSIS — M791 Myalgia: Secondary | ICD-10-CM | POA: Insufficient documentation

## 2016-02-16 LAB — CBC WITH DIFFERENTIAL/PLATELET
BASOS PCT: 0 %
Basophils Absolute: 0 10*3/uL (ref 0.0–0.1)
EOS ABS: 0.1 10*3/uL (ref 0.0–0.7)
EOS PCT: 3 %
HCT: 37.7 % (ref 36.0–46.0)
Hemoglobin: 12.9 g/dL (ref 12.0–15.0)
LYMPHS ABS: 2 10*3/uL (ref 0.7–4.0)
Lymphocytes Relative: 42 %
MCH: 32.3 pg (ref 26.0–34.0)
MCHC: 34.2 g/dL (ref 30.0–36.0)
MCV: 94.5 fL (ref 78.0–100.0)
Monocytes Absolute: 0.3 10*3/uL (ref 0.1–1.0)
Monocytes Relative: 7 %
NEUTROS PCT: 48 %
Neutro Abs: 2.3 10*3/uL (ref 1.7–7.7)
PLATELETS: 161 10*3/uL (ref 150–400)
RBC: 3.99 MIL/uL (ref 3.87–5.11)
RDW: 12.9 % (ref 11.5–15.5)
WBC: 4.7 10*3/uL (ref 4.0–10.5)

## 2016-02-16 LAB — BASIC METABOLIC PANEL
Anion gap: 7 (ref 5–15)
BUN: 7 mg/dL (ref 6–20)
CHLORIDE: 110 mmol/L (ref 101–111)
CO2: 25 mmol/L (ref 22–32)
CREATININE: 0.75 mg/dL (ref 0.44–1.00)
Calcium: 8.9 mg/dL (ref 8.9–10.3)
Glucose, Bld: 87 mg/dL (ref 65–99)
POTASSIUM: 3.5 mmol/L (ref 3.5–5.1)
SODIUM: 142 mmol/L (ref 135–145)

## 2016-02-16 MED ORDER — CETIRIZINE HCL 10 MG PO TABS: 10.0000 mg | ORAL_TABLET | Freq: Every day | ORAL | Status: AC

## 2016-02-16 NOTE — ED Provider Notes (Signed)
History  By signing my name below, I, Gail Tucker, attest that this documentation has been prepared under the direction and in the presence of Montine Circle, PA-C. Electronically Signed: Marlowe Tucker, ED Scribe. 02/16/2016. 1:44 PM.  Chief Complaint  Patient presents with  . Generalized Body Aches    2 day hx of body aches   The history is provided by the patient and medical records. No language interpreter was used.    HPI Comments:  Gail Tucker is a 40 y.o. female who presents to the Emergency Department complaining of generalized body aches that began two days ago. She reports associated chills and subjective fever yesterday. She reports HA and diarrhea. She has not taken anything to treat her symptoms. She denies modifying factors. She did not receive a flu vaccination this year. She denies cough, sore throat, CP, SOB, nausea, vomiting, dysuria, rash, insect bites. Pt states she had radiation for right shoulder sarcoma that ended about 5 months ago.  Past Medical History  Diagnosis Date  . GERD (gastroesophageal reflux disease)     occasional  . Wears partial dentures     lower  . Wears dentures     upper  . Migraines     occasional  . Cancer ([place]) 06/2015    right  shoulder dermatofibrosarcoma protiberans  . Common migraine with intractable migraine 11/18/2015  . Radiation 08/17/15-09/28/15    right posterior shoulder 5000 cGy with reduced field boost of 600 cG y   Past Surgical History  Procedure Laterality Date  . Dilation and curettage of uterus    . Tubal ligation  09/25/2005  . Multiple tooth extractions    . Dilation and evacuation  06/12/2001  . Lipoma excision Right 05/26/2015    Procedure: EXCISION CYST ON RIGHT SHOULDER;  Surgeon: Erroll Luna, MD;  Location: [facility];  Service: General;  Laterality: Right;   Family History  Problem Relation Age of Onset  . HIV/AIDS Father    Social History  Substance Use Topics  . Smoking  status: Current Every Day Smoker -- 0.25 packs/day for 10 years    Last Attempt to Quit: 06/21/2015  . Smokeless tobacco: Never Used  . Alcohol Use: No   OB History    No data available     Review of Systems  Constitutional: Positive for fever (subjective) and chills.  HENT: Negative for sore throat.   Respiratory: Negative for cough and shortness of breath.   Cardiovascular: Negative for chest pain.  Gastrointestinal: Negative for nausea and vomiting.  Genitourinary: Negative for dysuria.  Musculoskeletal: Positive for myalgias.  Skin: Negative for rash.    Allergies  Tetracyclines & related  Home Medications   Prior to Admission medications   Medication Sig Start Date End Date Taking? Authorizing Provider  esomeprazole (NEXIUM) 20 MG capsule Take 20 mg by mouth daily as needed (indigestion.). Reported on 12/10/2015   Yes Historical Provider, MD  megestrol (MEGACE) 40 MG/ML suspension Take 200 mg by mouth 2 (two) times daily.    Yes Historical Provider, MD  Oxycodone HCl 10 MG TABS Take 10 mg by mouth 3 (three) times daily as needed. pain 02/02/16  Yes Historical Provider, MD  propranolol (INDERAL) 20 MG tablet Take 1 tablet (20 mg total) by mouth 2 (two) times daily. 01/05/16  Yes Kathrynn Ducking, MD  topiramate (TOPAMAX) 25 MG tablet Take 50 mg by mouth at bedtime. 11/26/15  Yes Historical Provider, MD  predniSONE (DELTASONE) 20 MG tablet Take 2  tablets (40 mg total) by mouth daily. Patient not taking: Reported on 02/16/2016 01/27/16   Delsa Grana, PA-C   Triage Vitals: BP 138/90 mmHg  Pulse 68  Temp(Src) 98 F (36.7 C) (Oral)  Resp 20  Wt 108 lb (48.988 kg)  SpO2 100%  LMP 02/13/2016 (Exact Date) Physical Exam  Physical Exam  Constitutional: Pt  is oriented to person, place, and time. Appears well-developed and well-nourished. No distress.  HENT:  Head: Normocephalic and atraumatic.  Right Ear: Tympanic membrane, external ear and ear canal normal.  Left Ear: Tympanic  membrane, external ear and ear canal normal.  Nose: Mucosal edema and mild rhinorrhea present. No epistaxis. Right sinus exhibits no maxillary sinus tenderness and no frontal sinus tenderness. Left sinus exhibits no maxillary sinus tenderness and no frontal sinus tenderness.  Mouth/Throat: Uvula is midline and mucous membranes are normal. Mucous membranes are not pale and not cyanotic. No oropharyngeal exudate, posterior oropharyngeal edema, posterior oropharyngeal erythema or tonsillar abscesses.  Eyes: Conjunctivae are normal. Pupils are equal, round, and reactive to light.  Neck: Normal range of motion and full passive range of motion without pain.  Cardiovascular: Normal rate and intact distal pulses.   Pulmonary/Chest: Effort normal and breath sounds normal. No stridor.  Clear and equal breath sounds without focal wheezes, rhonchi, rales  Abdominal: Soft. Bowel sounds are normal. There is no tenderness.  Musculoskeletal: Normal range of motion.  Lymphadenopathy:    Pthas no cervical adenopathy.  Neurological: Pt is alert and oriented to person, place, and time.  Skin: Skin is warm and dry. No rash noted. Pt is not diaphoretic.  Psychiatric: Normal mood and affect.  Nursing note and vitals reviewed.   ED Course  Procedures (including critical care time) DIAGNOSTIC STUDIES: Oxygen Saturation is 100% on RA, normal by my interpretation.   COORDINATION OF CARE: 1:08 PM- Will order CXR and labs. Pt verbalizes understanding and agrees to plan.  Medications - No data to display  Results for orders placed or performed during the hospital encounter of 02/16/16  CBC with Differential/Platelet  Result Value Ref Range   WBC 4.7 4.0 - 10.5 K/uL   RBC 3.99 3.87 - 5.11 MIL/uL   Hemoglobin 12.9 12.0 - 15.0 g/dL   HCT 37.7 36.0 - 46.0 %   MCV 94.5 78.0 - 100.0 fL   MCH 32.3 26.0 - 34.0 pg   MCHC 34.2 30.0 - 36.0 g/dL   RDW 12.9 11.5 - 15.5 %   Platelets 161 150 - 400 K/uL   Neutrophils  Relative % 48 %   Neutro Abs 2.3 1.7 - 7.7 K/uL   Lymphocytes Relative 42 %   Lymphs Abs 2.0 0.7 - 4.0 K/uL   Monocytes Relative 7 %   Monocytes Absolute 0.3 0.1 - 1.0 K/uL   Eosinophils Relative 3 %   Eosinophils Absolute 0.1 0.0 - 0.7 K/uL   Basophils Relative 0 %   Basophils Absolute 0.0 0.0 - 0.1 K/uL  Basic metabolic panel  Result Value Ref Range   Sodium 142 135 - 145 mmol/L   Potassium 3.5 3.5 - 5.1 mmol/L   Chloride 110 101 - 111 mmol/L   CO2 25 22 - 32 mmol/L   Glucose, Bld 87 65 - 99 mg/dL   BUN 7 6 - 20 mg/dL   Creatinine, Ser 0.75 0.44 - 1.00 mg/dL   Calcium 8.9 8.9 - 10.3 mg/dL   GFR calc non Af Amer >60 >60 mL/min   GFR calc Af Amer >  60 >60 mL/min   Anion gap 7 5 - 15   Dg Chest 2 View  02/16/2016  CLINICAL DATA:  Fever for 2 days EXAM: CHEST  2 VIEW COMPARISON:  August 28, 2015 FINDINGS: Lungs are clear. Heart size and pulmonary vascularity are normal. No adenopathy. There is mid thoracic dextroscoliosis with thoracolumbar levoscoliosis. No blastic or lytic bone lesions are evident. IMPRESSION: No edema or consolidation. Electronically Signed   By: Lowella Grip III M.D.   On: 02/16/2016 13:31    I have personally reviewed and evaluated these images and lab results as part of my medical decision-making.    MDM   Final diagnoses:  Flu-like symptoms    Patient with symptoms consistent with flu-like illness vs URI.  Vitals are stable, low-grade fever at home.  No signs of dehydration, tolerating PO's.  Lungs are clear. Due to patient's presentation and physical exam a chest x-ray was not ordered bc likely diagnosis of flu.  Patient will be discharged with instructions to orally hydrate, rest, and use over-the-counter medications such as anti-inflammatories ibuprofen and Aleve for muscle aches and Tylenol for fever.  Patient will also be given a cough suppressant.    I personally performed the services described in this documentation, which was scribed in my  presence. The recorded information has been reviewed and is accurate.       Montine Circle, PA-C 02/16/16 1442  Sherwood Gambler, MD 02/17/16 [phone]

## 2016-02-16 NOTE — ED Notes (Signed)
Called no answer

## 2016-02-16 NOTE — Discharge Instructions (Signed)

## 2016-02-16 NOTE — ED Notes (Signed)
Pt reports generalized body aches. Did not use OTC meds to tx. Denies fever, c/o internmittent chills

## 2016-02-18 ENCOUNTER — Ambulatory Visit (INDEPENDENT_AMBULATORY_CARE_PROVIDER_SITE_OTHER): Payer: Medicaid Other | Admitting: Neurology

## 2016-02-18 ENCOUNTER — Encounter: Payer: Self-pay | Admitting: Neurology

## 2016-02-18 VITALS — BP 102/64 | HR 60 | Ht 63.0 in | Wt 103.5 lb

## 2016-02-18 DIAGNOSIS — R202 Paresthesia of skin: Secondary | ICD-10-CM

## 2016-02-18 DIAGNOSIS — G43019 Migraine without aura, intractable, without status migrainosus: Secondary | ICD-10-CM | POA: Diagnosis not present

## 2016-02-18 DIAGNOSIS — C44692 Other specified malignant neoplasm of skin of right upper limb, including shoulder: Secondary | ICD-10-CM | POA: Diagnosis not present

## 2016-02-18 MED ORDER — PREGABALIN 25 MG PO CAPS: 25.0000 mg | ORAL_CAPSULE | Freq: Two times a day (BID) | ORAL | Status: DC

## 2016-02-18 NOTE — Progress Notes (Signed)
Reason for visit: Headache, shoulder pain  Gail Tucker is an 40 y.o. female  History of present illness:  Gail Tucker is a 40 year old right-handed black female with a history of intractable migraine headache, with daily headaches. 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 The patient has had ongoing discomfort in the right shoulder, the upper right arm, but she also has some left shoulder, neck discomfort, low back discomfort. The patient recently had an effusion the left knee that required treatment. The patient has not been able to tolerate gabapentin secondary to a metallic taste in the mouth, Topamax was tried for the headaches which was very effective, but she could not tolerate this due to cognitive changes. The patient has been given a trial on Inderal which has helped some, but has not been very effective for the headache or for the shoulder discomfort. The patient was set up for physical therapy, but because she has Medicaid, she could not get any significant number of PT sessions, she ended up having no physical therapy. The patient returns to the office today for an evaluation. The patient claims that her right shoulder and upper arm will throb and then it will become numb.  Past Medical History  Diagnosis Date  . GERD (gastroesophageal reflux disease)     occasional  . Wears partial dentures     lower  . Wears dentures     upper  . Migraines     occasional  . Cancer ([place]) 06/2015    right  shoulder dermatofibrosarcoma protiberans  . Common migraine with intractable migraine 11/18/2015  . Radiation 08/17/15-09/28/15    right posterior shoulder 5000 cGy with reduced field boost of 600 cG y    Past Surgical History  Procedure Laterality Date  . Dilation and curettage of uterus    . Tubal ligation  09/25/2005  . Multiple tooth extractions    . Dilation and evacuation  06/12/2001  . Lipoma excision  Right 05/26/2015    Procedure: EXCISION CYST ON RIGHT SHOULDER;  Surgeon: Erroll Luna, MD;  Location: [facility];  Service: General;  Laterality: Right;    Family History  Problem Relation Age of Onset  . HIV/AIDS Father     Social history:  reports that she has been smoking.  She has never used smokeless tobacco. She reports that she does not drink alcohol or use illicit drugs.    Allergies  Allergen Reactions  . Tetracyclines & Related Hives    Medications:  Prior to Admission medications   Medication Sig Start Date End Date Taking? Authorizing Provider  esomeprazole (NEXIUM) 20 MG capsule Take 20 mg by mouth daily as needed (indigestion.). Reported on 12/10/2015   Yes Historical Provider, MD  megestrol (MEGACE) 40 MG/ML suspension Take 200 mg by mouth 2 (two) times daily.    Yes Historical Provider, MD  Oxycodone HCl 10 MG TABS Take 10 mg by mouth 3 (three) times daily as needed. pain 02/02/16  Yes Historical Provider, MD  propranolol (INDERAL) 20 MG tablet Take 1 tablet (20 mg total) by mouth 2 (two) times daily. 01/05/16  Yes Kathrynn Ducking, MD  cetirizine (ZYRTEC ALLERGY) 10 MG tablet Take 1 tablet (10 mg total) by mouth daily. Patient not taking: Reported on 02/18/2016 02/16/16   Montine Circle, PA-C    ROS:  Out of a complete 14 system review of symptoms, the patient complains only of the following symptoms, and  all other reviewed systems are negative.  Decreased appetite, excessive sweating Ringing in the ears Sleep apnea Urinary urgency Joint pain, back pain, joint swelling, neck stiffness Headache, numbness, weakness Depression  Blood pressure 102/64, pulse 60, height 5\' 3"  (1.6 m), weight 103 lb 8 oz (46.947 kg), last menstrual period 02/13/2016.  Physical Exam  General: The patient is alert and cooperative at the time of the examination.  Skin: No significant peripheral edema is noted.   Neurologic Exam  Mental status: The patient is alert  and oriented x 3 at the time of the examination. The patient has apparent normal recent and remote memory, with an apparently normal attention span and concentration ability.   Cranial nerves: Facial symmetry is present. Speech is normal, no aphasia or dysarthria is noted. Extraocular movements are full. Visual fields are full.  Motor: The patient has good strength in all 4 extremities.  Sensory examination: Soft touch sensation is symmetric on the face, arms, and legs.  Coordination: The patient has good finger-nose-finger and heel-to-shin bilaterally.  Gait and station: The patient has a normal gait. Tandem gait is normal. Romberg is negative. No drift is seen.  Reflexes: Deep tendon reflexes are symmetric.   Assessment/Plan:  1. Right shoulder dermatofibrosarcoma protuberans  2. Migraine headache  3. Diffuse neuromuscular discomfort  The patient has discomfort throughout the shoulders, neck, back, could potentially have a fibromyalgia type syndrome. The patient needs to have headaches. Topamax is very effective, but she could not tolerate this. I will give a trial on Lyrica to see if she can do better, she will remain on Inderal for now. The patient is to contact me if she requires a dose adjustment. The patient will follow-up in 3 months. In the future, we may consider substituting Inderal for Zonegran to see if this helps the headache.  Jill Alexanders MD 02/18/2016 7:37 PM  [facility] [address] [place], [place] [address]  Phone [phone] Fax [phone]

## 2016-02-19 LAB — HIV ANTIBODY (ROUTINE TESTING W REFLEX): HIV SCREEN 4TH GENERATION: NONREACTIVE

## 2016-02-19 LAB — SEDIMENTATION RATE: Sed Rate: 6 mm/hr (ref 0–32)

## 2016-02-19 LAB — RHEUMATOID FACTOR

## 2016-02-19 LAB — B. BURGDORFI ANTIBODIES

## 2016-02-19 LAB — ANA W/REFLEX: ANA: NEGATIVE

## 2016-02-19 LAB — ANGIOTENSIN CONVERTING ENZYME: ANGIO CONVERT ENZYME: 23 U/L (ref 14–82)

## 2016-02-22 ENCOUNTER — Telehealth: Payer: Self-pay

## 2016-02-22 NOTE — Telephone Encounter (Signed)
Called pt w/ unremarkable lab results. Verbalized understanding and appreciation for call. 

## 2016-02-22 NOTE — Telephone Encounter (Signed)
-----   Message from Kathrynn Ducking, MD sent at 02/21/2016  4:07 PM EDT -----  The blood work results are unremarkable. Please call the patient.  ----- Message -----    From: [facility] Lab Results In Interface    Sent: 02/19/2016   7:44 AM      To: Kathrynn Ducking, MD

## 2016-03-02 ENCOUNTER — Ambulatory Visit
Admission: RE | Admit: 2016-03-02 | Discharge: 2016-03-02 | Disposition: A | Discharge status: Home / Self Care | Payer: Medicaid Other | Source: Ambulatory Visit | Attending: Radiation Oncology | Admitting: Radiation Oncology

## 2016-03-02 ENCOUNTER — Encounter: Payer: Self-pay | Admitting: Radiation Oncology

## 2016-03-02 VITALS — BP 99/71 | HR 87 | Temp 98.8°F | Resp 16 | Ht 63.0 in | Wt 102.5 lb

## 2016-03-02 DIAGNOSIS — C44692 Other specified malignant neoplasm of skin of right upper limb, including shoulder: Secondary | ICD-10-CM | POA: Diagnosis present

## 2016-03-02 DIAGNOSIS — M25511 Pain in right shoulder: Secondary | ICD-10-CM | POA: Diagnosis not present

## 2016-03-02 NOTE — Progress Notes (Signed)
Shawna Clamp here for follow up.  She reports having pain in her right shoulder and arm.  She said she has throbbing alternating with numbness in her right arm.  She is currently taking lyrica and oxycodone 10 mg - 2 tablets at bedtime.  She has seen Dr. Jannifer Franklin in neurology.  The skin on her right shoulder is scarred.  She reports it itches all the time.  She reports having a hard time sleeping and a poor appetite.  She is wondering if physical therapy if would help.  BP 99/71 mmHg  Pulse 87  Temp(Src) 98.8 F (37.1 C) (Oral)  Resp 16  Ht 5\' 3"  (1.6 m)  Wt 102 lb 8 oz (46.494 kg)  BMI 18.16 kg/m2  SpO2 100%  LMP 02/13/2016 (Exact Date)   Wt Readings from Last 3 Encounters:  03/02/16 102 lb 8 oz (46.494 kg)  02/18/16 103 lb 8 oz (46.947 kg)  02/16/16 108 lb (48.988 kg)

## 2016-03-02 NOTE — Progress Notes (Signed)
Radiation Oncology         [phone]) [phone] ________________________________  Name: Gail Tucker MRN: [id number]  Date: 03/02/2016  DOB: 11-Nov-1975   Follow-Up Visit Note  CC: Ricke Hey, MD  Erroll Luna, MD    ICD-9-CM ICD-10-CM   1. Dermatofibrosarcoma protubera of right shoulder 173.69 C44.692     Diagnosis:   Dermatofibroma sarcoma protuberans of the posterior right shoulder   Treatment Dates: 1 year and 5 months  08/17/2015 through 09/28/2015: Right posterior shoulder 5000 cGy in 25 sessions followed by reduced field boost 600 cGy in 3 sessions  (cumulative dose of 5600 cGy in 28 sessions).  Narrative:  The patient returns today for routine follow-up. She reports having pain in her right shoulder and arm. She said it is throbbing alternating with numbness. She is currently taking lyrica and oxycodone 10 mg - 2 tablets at bedtime. She has seen Dr. Jannifer Franklin in neurology. The skin on her right shoulder is scarred. She reports it itches all the time. She uses Vasolin for this.  She reports having a hard time sleeping and a poor appetite. She is wondering if physical therapy if would help , This issue was addressed by neurology in due to the patient's insurance, an adequate course of physical therapy was not possible  ALLERGIES:  is allergic to tetracyclines & related.  Meds: Current Outpatient Prescriptions  Medication Sig Dispense Refill  . Oxycodone HCl 10 MG TABS Take 10 mg by mouth 3 (three) times daily as needed. pain  0  . pregabalin (LYRICA) 25 MG capsule Take 1 capsule (25 mg total) by mouth 2 (two) times daily. 60 capsule 2  . cetirizine (ZYRTEC ALLERGY) 10 MG tablet Take 1 tablet (10 mg total) by mouth daily. (Patient not taking: Reported on 02/18/2016) 30 tablet 1  . esomeprazole (NEXIUM) 20 MG capsule Take 20 mg by mouth daily as needed (indigestion.). Reported on 03/02/2016    . megestrol (MEGACE) 40 MG/ML suspension Take 200 mg by mouth 2 (two) times daily.  Reported on 03/02/2016    . propranolol (INDERAL) 20 MG tablet Take 1 tablet (20 mg total) by mouth 2 (two) times daily. (Patient not taking: Reported on 03/02/2016) 60 tablet 3   No current facility-administered medications for this encounter.    Physical Findings: The patient is in no acute distress. Patient is alert and oriented.  height is 5\' 3"  (1.6 m) and weight is 102 lb 8 oz (46.494 kg). Her oral temperature is 98.8 F (37.1 C). Her blood pressure is 99/71 and her pulse is 87. Her respiration is 16 and oxygen saturation is 100%.   No palpable cervical, supraclavicular, or axillary adenopathy. Muscle strength and symmetry in the upper bilateral extremities 5/5. Right upper back she has surgical changes and areas of hyperpigmentation/hypopigmentation in the treatment area. No  signs of recurrence.  Lab Findings: Lab Results  Component Value Date   WBC 4.7 02/16/2016   HGB 12.9 02/16/2016   HCT 37.7 02/16/2016   MCV 94.5 02/16/2016   PLT 161 02/16/2016    Radiographic Findings: Dg Chest 2 View  02/16/2016  CLINICAL DATA:  Fever for 2 days EXAM: CHEST  2 VIEW COMPARISON:  August 28, 2015 FINDINGS: Lungs are clear. Heart size and pulmonary vascularity are normal. No adenopathy. There is mid thoracic dextroscoliosis with thoracolumbar levoscoliosis. No blastic or lytic bone lesions are evident. IMPRESSION: No edema or consolidation. Electronically Signed   By: Lowella Grip III M.D.   On: 02/16/2016 13:31  Impression:   No evidence of recurrence on clinical exam today.  Plan: She will return for a routine follow up in 3 months. She will continue close follow up with her neurologist. I see no need to order an MRI of the brachial plexus. She was given samples of Aquaphor to apply to the area for the pruritus. -----------------------------------  Blair Promise, PhD, MD  This document serves as a record of services personally performed by Gery Pray, MD. It was created on his  behalf by Darcus Austin, a trained medical scribe. The creation of this record is based on the scribe's personal observations and the provider's statements to them. This document has been checked and approved by the attending provider.

## 2016-03-04 NOTE — Addendum Note (Signed)
Encounter addended by: Jacqulyn Liner, RN on: 03/04/2016  7:35 AM<BR>     Documentation filed: Charges VN

## 2016-03-20 IMAGING — CR DG CHEST 2V
2 series · 2 of 2 positions shown · non-contrast
Comparison: January 07, 2009.

CLINICAL DATA: Shortness of breath, chest pain.

EXAM:
CHEST  2 VIEW

[w chest pa]
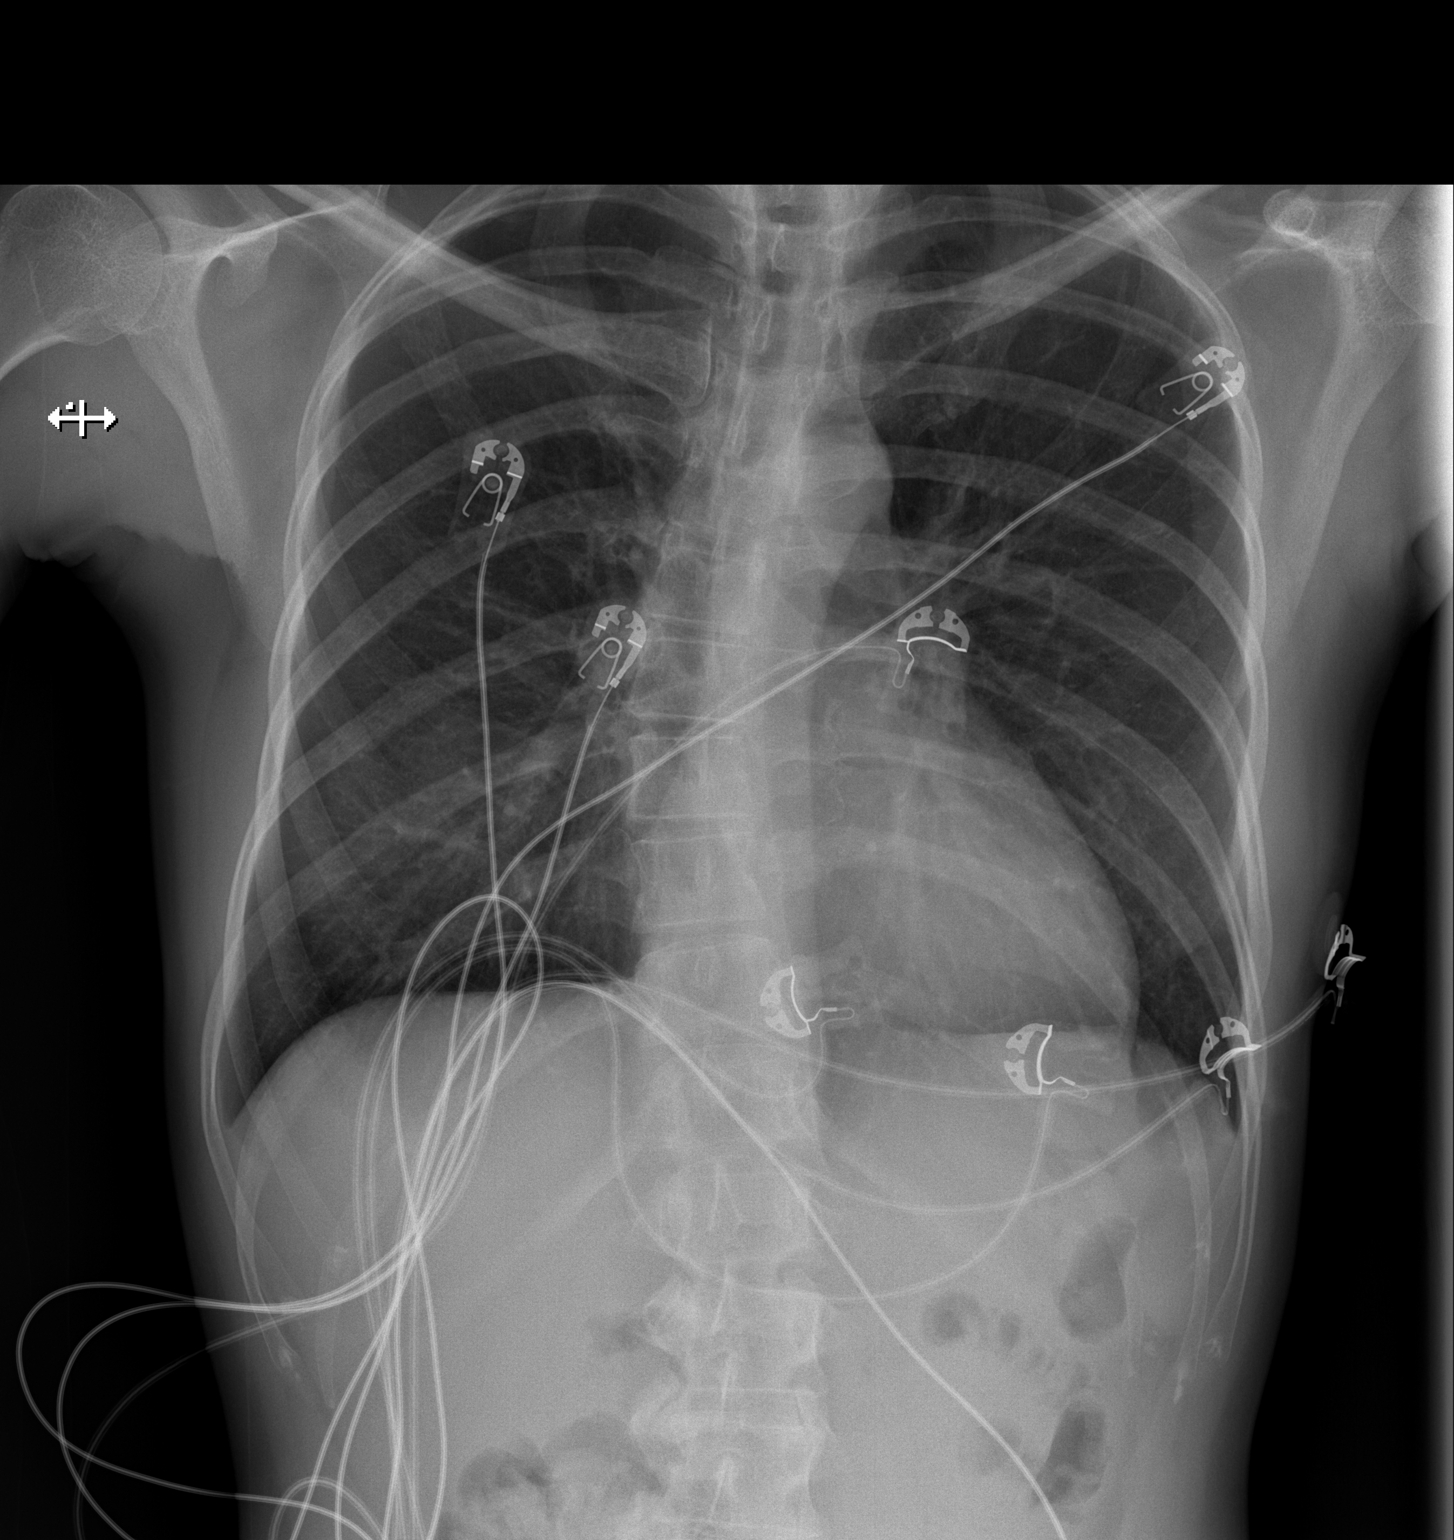

[w chest lat]
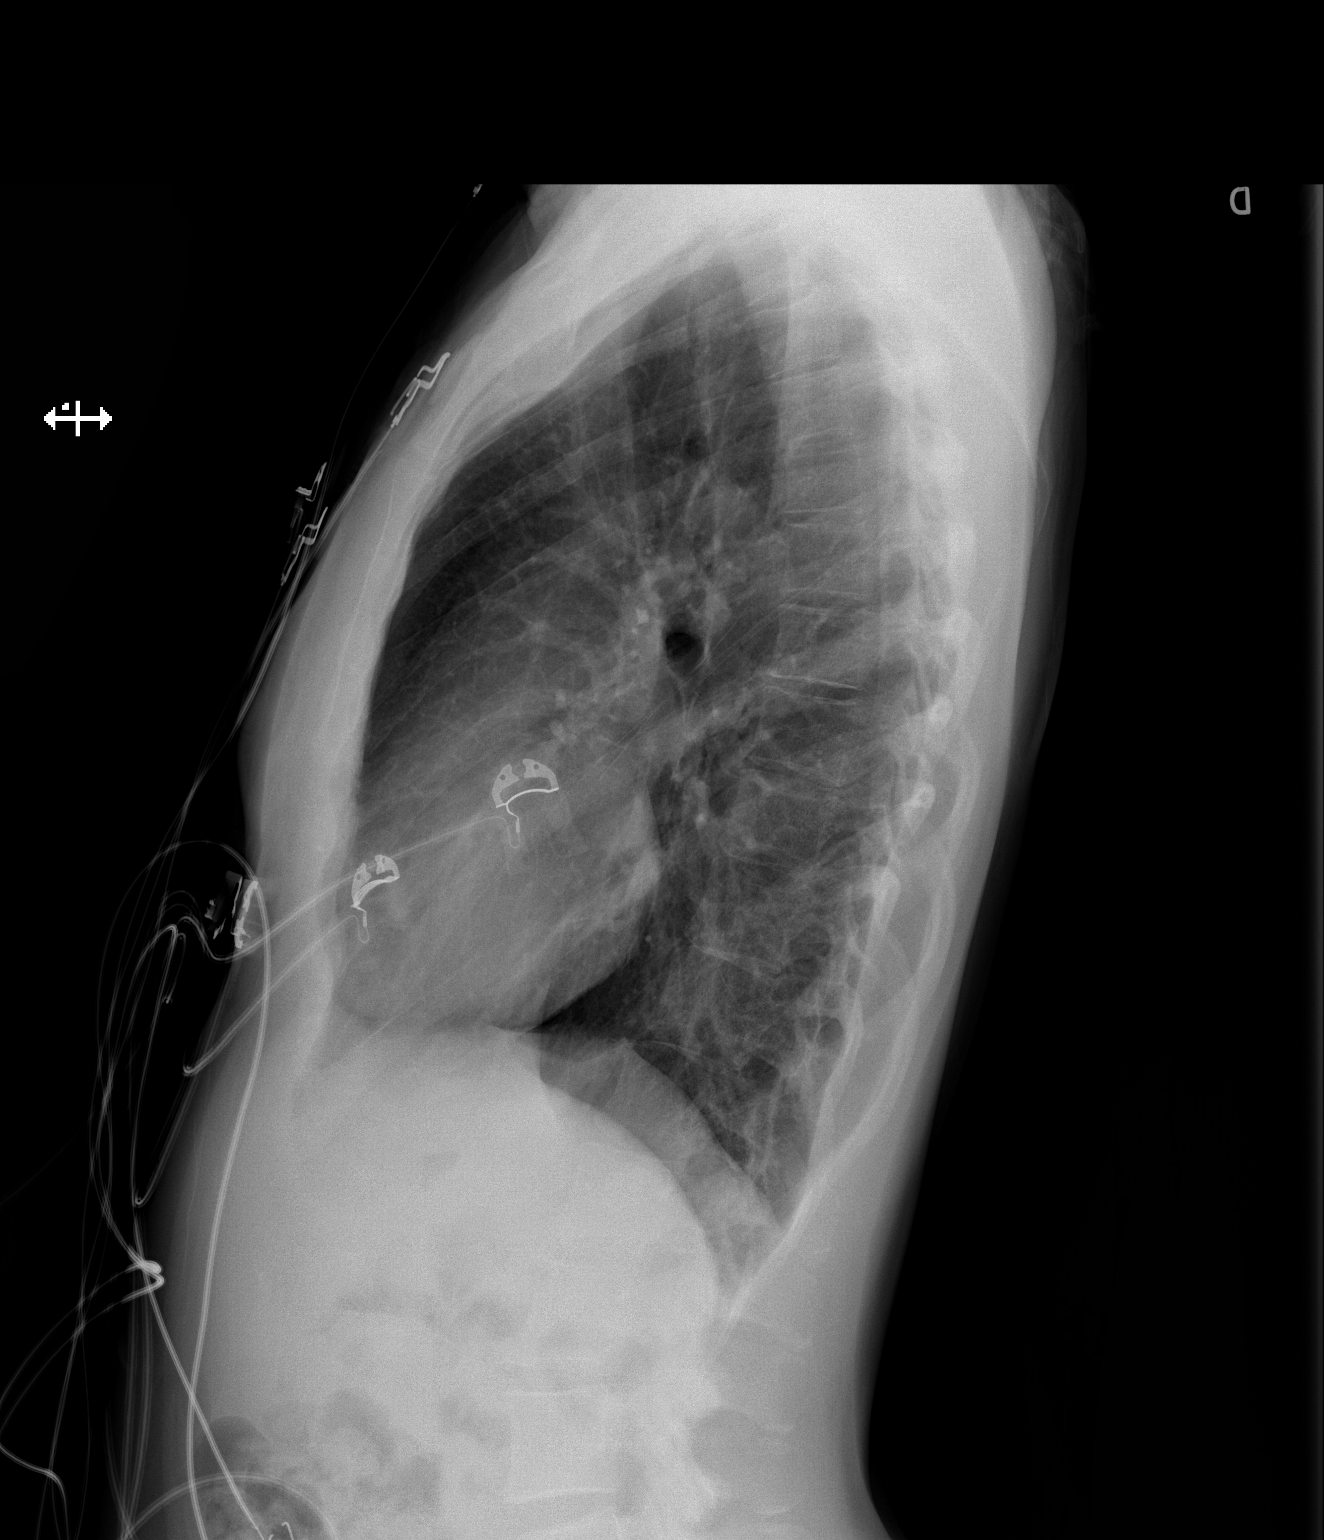

[2 of 2 positions shown; findings below may reference images not displayed]

FINDINGS: The heart size and mediastinal contours are within normal limits.
Both lungs are clear. No pneumothorax or pleural effusion is noted.
Stable mild dextroscoliosis of lower thoracic spine is noted.
IMPRESSION: No active cardiopulmonary disease.

## 2016-04-07 ENCOUNTER — Telehealth: Payer: Self-pay | Admitting: *Deleted

## 2016-04-07 NOTE — Telephone Encounter (Signed)
On 04-07-16 fax medical records to disability determination services it was two follow up notes.

## 2016-05-06 DIAGNOSIS — Z0271 Encounter for disability determination: Secondary | ICD-10-CM

## 2016-05-10 ENCOUNTER — Emergency Department (HOSPITAL_COMMUNITY)
Admission: EM | Admit: 2016-05-10 | Discharge: 2016-05-10 | Disposition: A | Discharge status: Home / Self Care | Payer: Medicaid Other | Attending: Emergency Medicine | Admitting: Emergency Medicine

## 2016-05-10 ENCOUNTER — Encounter (HOSPITAL_COMMUNITY): Payer: Self-pay | Admitting: *Deleted

## 2016-05-10 DIAGNOSIS — Z85828 Personal history of other malignant neoplasm of skin: Secondary | ICD-10-CM | POA: Insufficient documentation

## 2016-05-10 DIAGNOSIS — H579 Unspecified disorder of eye and adnexa: Secondary | ICD-10-CM | POA: Diagnosis present

## 2016-05-10 DIAGNOSIS — H0013 Chalazion right eye, unspecified eyelid: Secondary | ICD-10-CM | POA: Diagnosis not present

## 2016-05-10 DIAGNOSIS — H0016 Chalazion left eye, unspecified eyelid: Secondary | ICD-10-CM | POA: Diagnosis not present

## 2016-05-10 DIAGNOSIS — F17200 Nicotine dependence, unspecified, uncomplicated: Secondary | ICD-10-CM | POA: Insufficient documentation

## 2016-05-10 NOTE — ED Triage Notes (Signed)
Pt complains of boilsto both eyelids for the past month. Pt states she has tried warm compresses, which she states has made the boils bigger.

## 2016-05-10 NOTE — ED Provider Notes (Signed)
[facility] DEPT Provider Note   CSN: [id number] Arrival date & time: 05/10/16  1805  First Provider Contact:  None    By signing my name below, I, Higinio Plan, attest that this documentation has been prepared under the direction and in the presence of non-physician practitioner, Gloriann Loan, PA-C. Electronically Signed: Higinio Plan, Scribe. 05/10/2016. 7:15 PM.   History   Chief Complaint Chief Complaint  Patient presents with  . Eye Problem   The history is provided by the patient. No language interpreter was used.   HPI Comments: Gail Tucker is a 40 y.o. female who presents to the Emergency Department complaining of gradually worsening, bumps on her bilateral eyelids that began 1 month ago and worsened today. Pt reports associated mild blurry vision. She notes she visited Urgent Care recently for the same symptoms in which she was told she had a chalazion and prescribed eye drops with no relief. She states she has also used warm compresses for the past month with no relief; she believes it is making her bumps larger. She denies eye drainage, fever and chills, N/V, eyelid swelling. She states she does not currently see an ophthalmologist.   Past Medical History:  Diagnosis Date  . Cancer ([place]) 06/2015   right  shoulder dermatofibrosarcoma protiberans  . Common migraine with intractable migraine 11/18/2015  . GERD (gastroesophageal reflux disease)    occasional  . Migraines    occasional  . Radiation 08/17/15-09/28/15   right posterior shoulder 5000 cGy with reduced field boost of 600 cG y  . Wears dentures    upper  . Wears partial dentures    lower    Patient Active Problem List   Diagnosis Date Noted  . Common migraine with intractable migraine 11/18/2015  . Paresthesia of arm 11/18/2015  . 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 DILATION AND CURETTAGE OF UTERUS    . DILATION AND EVACUATION   06/12/2001  . LIPOMA EXCISION Right 05/26/2015   Procedure: EXCISION CYST ON RIGHT SHOULDER;  Surgeon: Erroll Luna, MD;  Location: [facility];  Service: General;  Laterality: Right;  . MULTIPLE TOOTH EXTRACTIONS    . TUBAL LIGATION  09/25/2005    OB History    No data available       Home Medications    Prior to Admission medications   Medication Sig Start Date End Date Taking? Authorizing Provider  cetirizine (ZYRTEC ALLERGY) 10 MG tablet Take 1 tablet (10 mg total) by mouth daily. Patient not taking: Reported on 02/18/2016 02/16/16   Montine Circle, PA-C  esomeprazole (NEXIUM) 20 MG capsule Take 20 mg by mouth daily as needed (indigestion.). Reported on 03/02/2016    Historical Provider, MD  megestrol (MEGACE) 40 MG/ML suspension Take 200 mg by mouth 2 (two) times daily. Reported on 03/02/2016    Historical Provider, MD  Oxycodone HCl 10 MG TABS Take 10 mg by mouth 3 (three) times daily as needed. pain 02/02/16   Historical Provider, MD  pregabalin (LYRICA) 25 MG capsule Take 1 capsule (25 mg total) by mouth 2 (two) times daily. 02/18/16   Kathrynn Ducking, MD  propranolol (INDERAL) 20 MG tablet Take 1 tablet (20 mg total) by mouth 2 (two) times daily. Patient not taking: Reported on 03/02/2016 01/05/16   Kathrynn Ducking, MD    Family History Family History  Problem Relation Age of Onset  . HIV/AIDS Father  Social History Social History  Substance Use Topics  . Smoking status: Current Every Day Smoker    Packs/day: 0.25    Years: 10.00    Last attempt to quit: 06/21/2015  . Smokeless tobacco: Never Used  . Alcohol use No     Allergies   Tetracyclines & related   Review of Systems Review of Systems  Constitutional: Negative for chills and fever.  Eyes: Positive for visual disturbance. Negative for discharge.  All other systems reviewed and are negative.  Physical Exam Updated Vital Signs BP 108/81 (BP Location: Left Arm)   Pulse 94   Temp 99.2 F  (37.3 C) (Oral)   Resp 18   LMP 04/09/2016   SpO2 98%   Physical Exam  Constitutional: She is oriented to person, place, and time. She appears well-developed and well-nourished.  HENT:  Head: Normocephalic and atraumatic.  Right Ear: External ear normal.  Left Ear: External ear normal.  No periorbital cellulitis.  Bilateral mild-moderate painless rubbery nodule.  No drainage.    Visual Acuity  Right Eye Distance: 20/40 Left Eye Distance: 20/25 Bilateral Distance: 20/20       Eyes: Conjunctivae and EOM are normal. Pupils are equal, round, and reactive to light. Lids are everted and swept, no foreign bodies found. Right eye exhibits hordeolum. Right eye exhibits no discharge. Left eye exhibits hordeolum. Left eye exhibits no discharge. Right conjunctiva is not injected. Right conjunctiva has no hemorrhage. Left conjunctiva is not injected. Left conjunctiva has no hemorrhage. No scleral icterus.  Neck: No tracheal deviation present.  Cardiovascular: Normal rate, regular rhythm and normal heart sounds.   Pulmonary/Chest: Effort normal and breath sounds normal. No respiratory distress.  Abdominal: She exhibits no distension.  Musculoskeletal: Normal range of motion.  Neurological: She is alert and oriented to person, place, and time.  Skin: Skin is warm and dry.  Psychiatric: She has a normal mood and affect. Her behavior is normal.   ED Treatments / Results  Labs (all labs ordered are listed, but only abnormal results are displayed) Labs Reviewed - No data to display  EKG  EKG Interpretation None       Radiology No results found.  Procedures Procedures  DIAGNOSTIC STUDIES:  Oxygen Saturation is 98% on RA, normal by my interpretation.    COORDINATION OF CARE:  7:15 PM Discussed treatment plan with pt at bedside and pt agreed to plan.   Medications Ordered in ED Medications - No data to display   Initial Impression / Assessment and Plan / ED Course  I have  reviewed the triage vital signs and the nursing notes.  Pertinent labs & imaging results that were available during my care of the patient were reviewed by me and considered in my medical decision making (see chart for details).  Clinical Course   Patient presents with findings consistent with hordeolum that has progressed to chalazion.  No evidence of periorbital cellulitis; therefore, no indication for abx at this time.  Recommend continued warm compresses 15 minutes QID.  Follow up with ophthalmology for possible incision and curettage.  Return precautions discussed.  Patient agrees and acknowledges the above plan for discharge.    Final Clinical Impressions(s) / ED Diagnoses   Final diagnoses:  Chalazion of both eyes    New Prescriptions New Prescriptions   No medications on file   I personally performed the services described in this documentation, which was scribed in my presence. The recorded information has been reviewed and is accurate.  Gloriann Loan, PA-C 05/10/16 [place], MD 05/10/16 [phone]

## 2016-05-23 ENCOUNTER — Encounter: Payer: Self-pay | Admitting: Adult Health

## 2016-05-23 ENCOUNTER — Ambulatory Visit (INDEPENDENT_AMBULATORY_CARE_PROVIDER_SITE_OTHER): Payer: Medicaid Other | Admitting: Adult Health

## 2016-05-23 VITALS — BP 120/86 | HR 69 | Resp 14 | Ht 63.0 in | Wt 104.0 lb

## 2016-05-23 DIAGNOSIS — G43019 Migraine without aura, intractable, without status migrainosus: Secondary | ICD-10-CM

## 2016-05-23 MED ORDER — PREGABALIN 25 MG PO CAPS: 50.0000 mg | ORAL_CAPSULE | Freq: Two times a day (BID) | ORAL | 5 refills | Status: AC

## 2016-05-23 NOTE — Patient Instructions (Signed)
Increase lyrica 50 mg twice a day.  If your symptoms worsen or you develop new symptoms please let us know.

## 2016-05-23 NOTE — Progress Notes (Signed)
PATIENT: Gail Tucker DOB: 09-24-1976  REASON FOR VISIT: follow up- migraine headache HISTORY FROM: patient  HISTORY OF PRESENT ILLNESS: Gail Tucker is a 40 year old female with a history of intractable migraine headaches. She returns today for follow-up. She was started on Lyrica 25 mg twice a day. She reports that she's not noticed any benefit with Lyrica. She continues to have a headache daily. Typically located in the temporal regions. She confirms photophobia and phonophobia as well as nausea. Denies vomiting. She states that she is also on oxycodone for pain and typically takes 2 tablets every 6 hours. She returns today for an evaluation.  HISTORY 02/18/16: Gail Tucker is a 40 year old right-handed black female with a history of intractable migraine headache, with daily headaches. 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 The patient has had ongoing discomfort in the right shoulder, the upper right arm, but she also has some left shoulder, neck discomfort, low back discomfort. The patient recently had an effusion the left knee that required treatment. The patient has not been able to tolerate gabapentin secondary to a metallic taste in the mouth, Topamax was tried for the headaches which was very effective, but she could not tolerate this due to cognitive changes. The patient has been given a trial on Inderal which has helped some, but has not been very effective for the headache or for the shoulder discomfort. The patient was set up for physical therapy, but because she has Medicaid, she could not get any significant number of PT sessions, she ended up having no physical therapy. The patient returns to the office today for an evaluation. The patient claims that her right shoulder and upper arm will throb and then it will become numb.  REVIEW OF SYSTEMS: Out of a complete 14 system review of symptoms, the patient  complains only of the following symptoms, and all other reviewed systems are negative. Activity change, fatigue, excessive sweating, ringing in ears, choking, eye pain, eye redness, eye itching, eye discharge, leg swelling, frequent waking, diarrhea, excessive thirst, frequency of urination, back pain, neck stiffness, itching, hallucinations, nervous/anxious, agitation, numbness, headache  ALLERGIES: Allergies  Allergen Reactions  . Tetracyclines & Related Hives    HOME MEDICATIONS: Outpatient Medications Prior to Visit  Medication Sig Dispense Refill  . cetirizine (ZYRTEC ALLERGY) 10 MG tablet Take 1 tablet (10 mg total) by mouth daily. 30 tablet 1  . esomeprazole (NEXIUM) 20 MG capsule Take 20 mg by mouth daily as needed (indigestion.). Reported on 03/02/2016    . megestrol (MEGACE) 40 MG/ML suspension Take 200 mg by mouth 2 (two) times daily. Reported on 03/02/2016    . Oxycodone HCl 10 MG TABS Take 10 mg by mouth 3 (three) times daily as needed. pain  0  . pregabalin (LYRICA) 25 MG capsule Take 1 capsule (25 mg total) by mouth 2 (two) times daily. 60 capsule 2  . propranolol (INDERAL) 20 MG tablet Take 1 tablet (20 mg total) by mouth 2 (two) times daily. 60 tablet 3   No facility-administered medications prior to visit.     PAST MEDICAL HISTORY: Past Medical History:  Diagnosis Date  . Cancer ([place]) 06/2015   right  shoulder dermatofibrosarcoma protiberans  . Common migraine with intractable migraine 11/18/2015  . GERD (gastroesophageal reflux disease)    occasional  . Migraines    occasional  . Radiation 08/17/15-09/28/15   right posterior shoulder 5000 cGy with reduced field boost of  59 cG y  . Wears dentures    upper  . Wears partial dentures    lower    PAST SURGICAL HISTORY: Past Surgical History:  Procedure Laterality Date  . DILATION AND CURETTAGE OF UTERUS    . DILATION AND EVACUATION  06/12/2001  . LIPOMA EXCISION Right 05/26/2015   Procedure: EXCISION CYST ON RIGHT  SHOULDER;  Surgeon: Erroll Luna, MD;  Location: [facility];  Service: General;  Laterality: Right;  . MULTIPLE TOOTH EXTRACTIONS    . TUBAL LIGATION  09/25/2005    FAMILY HISTORY: Family History  Problem Relation Age of Onset  . HIV/AIDS Father     SOCIAL HISTORY: Social History   Social History  . Marital status: Single    Spouse name: N/A  . Number of children: 3  . Years of education: some coll.   Occupational History  . possible disability soon    Social History Main Topics  . Smoking status: Current Every Day Smoker    Packs/day: 0.25    Years: 10.00    Last attempt to quit: 06/21/2015  . Smokeless tobacco: Never Used  . Alcohol use No  . Drug use: No  . Sexual activity: Not on file   Other Topics Concern  . Not on file   Social History Narrative   Patient drinks about 7-8 sodas daily.   Patient is right handed.       PHYSICAL EXAM  Vitals:   05/23/16 0831  BP: 120/86  Pulse: 69  Resp: 14  Weight: 104 lb (47.2 kg)  Height: 5\' 3"  (1.6 m)   Body mass index is 18.42 kg/m.  Generalized: Well developed, in no acute distress   Neurological examination  Mentation: Alert oriented to time, place, history taking. Follows all commands speech and language fluent Cranial nerve II-XII: Pupils were equal round reactive to light. Extraocular movements were full, visual field were full on confrontational test. Facial sensation and strength were normal. Uvula tongue midline. Head turning and shoulder shrug  were normal and symmetric. Motor: The motor testing reveals 5 over 5 strength of all 4 extremities. Good symmetric motor tone is noted throughout.  Sensory: Sensory testing is intact to soft touch on all 4 extremities. No evidence of extinction is noted.  Coordination: Cerebellar testing reveals good finger-nose-finger and heel-to-shin bilaterally.  Gait and station: Gait is normal. Tandem gait is normal. Romberg is negative. No drift is seen.    Reflexes: Deep tendon reflexes are symmetric and normal bilaterally.   DIAGNOSTIC DATA (LABS, IMAGING, TESTING) - I reviewed patient records, labs, notes, testing and imaging myself where available.  Lab Results  Component Value Date   WBC 4.7 02/16/2016   HGB 12.9 02/16/2016   HCT 37.7 02/16/2016   MCV 94.5 02/16/2016   PLT 161 02/16/2016      Component Value Date/Time   NA 142 02/16/2016 1336   K 3.5 02/16/2016 1336   CL 110 02/16/2016 1336   CO2 25 02/16/2016 1336   GLUCOSE 87 02/16/2016 1336   BUN 7 02/16/2016 1336   CREATININE 0.75 02/16/2016 1336   CALCIUM 8.9 02/16/2016 1336   GFRNONAA >60 02/16/2016 1336   GFRAA >60 02/16/2016 1336      ASSESSMENT AND PLAN 40 y.o. year old female  has a past medical history of Cancer ([place]) (06/2015); Common migraine with intractable migraine (11/18/2015); GERD (gastroesophageal reflux disease); Migraines; Radiation (08/17/15-09/28/15); Wears dentures; and Wears partial dentures. here with:  1. Migraine headaches  The patient continues to have daily headaches. She is on low-dose of Lyrica. I will increase to 50 mg twice a day. If this is not beneficial we may try a different medication such as Zonegran. Patient verbalized understanding. She will remain on Inderal for now. Follow-up in 4 months or sooner if needed.     Ward Givens, MSN, NP-C 05/23/2016, 8:45 AM [facility] [address], [place], [place] [address] [phone]

## 2016-05-31 ENCOUNTER — Encounter: Payer: Self-pay | Admitting: Obstetrics & Gynecology

## 2016-05-31 ENCOUNTER — Ambulatory Visit (INDEPENDENT_AMBULATORY_CARE_PROVIDER_SITE_OTHER): Payer: Medicaid Other | Admitting: Obstetrics & Gynecology

## 2016-05-31 VITALS — BP 139/89 | HR 64 | Wt 106.0 lb

## 2016-05-31 DIAGNOSIS — Z0000 Encounter for general adult medical examination without abnormal findings: Secondary | ICD-10-CM | POA: Diagnosis not present

## 2016-05-31 DIAGNOSIS — N921 Excessive and frequent menstruation with irregular cycle: Secondary | ICD-10-CM | POA: Insufficient documentation

## 2016-05-31 DIAGNOSIS — Z01419 Encounter for gynecological examination (general) (routine) without abnormal findings: Secondary | ICD-10-CM | POA: Diagnosis not present

## 2016-05-31 NOTE — Progress Notes (Signed)
Patient ID: Gail Tucker, female   DOB: November 12, 1975, 40 y.o.   MRN: [id number]  Chief Complaint  Patient presents with  . Gynecologic Exam  heavy periods  HPI Gail Tucker is a 40 y.o. female.  [id number] Patient's last menstrual period was 05/17/2016.' For many years since BTL menses are heavy and may last for 2-3 weeks, with pain. Irregular cycle especially since radiation therapy for dermatofibrosarcoma right shoulded HPI  Past Medical History:  Diagnosis Date  . Cancer ([place]) 06/2015   right  shoulder dermatofibrosarcoma protiberans  . Common migraine with intractable migraine 11/18/2015  . GERD (gastroesophageal reflux disease)    occasional  . Migraines    occasional  . Radiation 08/17/15-09/28/15   right posterior shoulder 5000 cGy with reduced field boost of 600 cG y  . Wears dentures    upper  . Wears partial dentures    lower    Past Surgical History:  Procedure Laterality Date  . DILATION AND CURETTAGE OF UTERUS    . DILATION AND EVACUATION  06/12/2001  . LIPOMA EXCISION Right 05/26/2015   Procedure: EXCISION CYST ON RIGHT SHOULDER;  Surgeon: Erroll Luna, MD;  Location: [facility];  Service: General;  Laterality: Right;  . MULTIPLE TOOTH EXTRACTIONS    . TUBAL LIGATION  09/25/2005    Family History  Problem Relation Age of Onset  . Hypertension Mother   . HIV/AIDS Father   . Diabetes Maternal Grandmother   . Hypertension Maternal Grandmother   . Cancer Neg Hx     Social History Social History  Substance Use Topics  . Smoking status: Current Every Day Smoker    Packs/day: 0.25    Years: 10.00    Last attempt to quit: 06/21/2015  . Smokeless tobacco: Never Used  . Alcohol use No    Allergies  Allergen Reactions  . Tetracyclines & Related Hives    Current Outpatient Prescriptions  Medication Sig Dispense Refill  . megestrol (MEGACE) 40 MG/ML suspension Take 200 mg by mouth 2 (two) times daily. Reported on 03/02/2016    . Nicotine  21-14-7 MG/24HR KIT See admin instructions.  0  . pregabalin (LYRICA) 25 MG capsule Take 2 capsules (50 mg total) by mouth 2 (two) times daily. 120 capsule 5  . cetirizine (ZYRTEC ALLERGY) 10 MG tablet Take 1 tablet (10 mg total) by mouth daily. (Patient not taking: Reported on 05/31/2016) 30 tablet 1  . esomeprazole (NEXIUM) 20 MG capsule Take 20 mg by mouth daily as needed (indigestion.). Reported on 03/02/2016    . Oxycodone HCl 10 MG TABS Take 10 mg by mouth 3 (three) times daily as needed. pain  0  . propranolol (INDERAL) 20 MG tablet Take 1 tablet (20 mg total) by mouth 2 (two) times daily. (Patient not taking: Reported on 05/31/2016) 60 tablet 3   No current facility-administered medications for this visit.     Review of Systems Review of Systems  Constitutional: Positive for appetite change and unexpected weight change. Fatigue: cant maintain her weight.  Respiratory: Negative.   Cardiovascular: Negative.   Gastrointestinal: Negative.   Genitourinary: Positive for menstrual problem and pelvic pain. Negative for vaginal discharge and vaginal pain.    Blood pressure 139/89, pulse 64, weight 106 lb (48.1 kg), last menstrual period 05/17/2016.  Physical Exam Physical Exam  Constitutional: She is oriented to person, place, and time. No distress.  thin  Cardiovascular: Normal rate.   Pulmonary/Chest: Effort normal. No respiratory distress.  Breasts: breasts appear normal,  no suspicious masses, no skin or nipple changes or axillary nodes.   Abdominal: Soft. She exhibits no distension and no mass. There is no tenderness.  Genitourinary: Vagina normal and uterus normal. No vaginal discharge found.  Genitourinary Comments: Retroverted no masses or tenderness  Neurological: She is alert and oriented to person, place, and time.  Skin: Skin is warm and dry.  Psychiatric: She has a normal mood and affect. Her behavior is normal.    Data Reviewed History   Assessment    Well woman  exam Patient Active Problem List   Diagnosis Date Noted  . Menometrorrhagia 05/31/2016  . Common migraine with intractable migraine 11/18/2015  . Paresthesia of arm 11/18/2015  . 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       Plan    Consider Mirena Pelvic US  RTC 4 weeks Could be ablation candidate if declines IUD       Treyce Spillers 05/31/2016, 8:50 AM

## 2016-05-31 NOTE — Patient Instructions (Signed)
Levonorgestrel intrauterine device (IUD) What is this medicine? LEVONORGESTREL IUD (LEE voe nor jes trel) is a contraceptive (birth control) device. The device is placed inside the uterus by a healthcare professional. It is used to prevent pregnancy and can also be used to treat heavy bleeding that occurs during your period. Depending on the device, it can be used for 3 to 5 years. This medicine may be used for other purposes; ask your health care provider or pharmacist if you have questions. What should I tell my health care provider before I take this medicine? They need to know if you have any of these conditions: -abnormal Pap smear -cancer of the breast, uterus, or cervix -diabetes -endometritis -genital or pelvic infection now or in the past -have more than one sexual partner or your partner has more than one partner -heart disease -history of an ectopic or tubal pregnancy -immune system problems -IUD in place -liver disease or tumor -problems with blood clots or take blood-thinners -use intravenous drugs -uterus of unusual shape -vaginal bleeding that has not been explained -an unusual or allergic reaction to levonorgestrel, other hormones, silicone, or polyethylene, medicines, foods, dyes, or preservatives -pregnant or trying to get pregnant -breast-feeding How should I use this medicine? This device is placed inside the uterus by a health care professional. Talk to your pediatrician regarding the use of this medicine in children. Special care may be needed. Overdosage: If you think you have taken too much of this medicine contact a poison control center or emergency room at once. NOTE: This medicine is only for you. Do not share this medicine with others. What if I miss a dose? This does not apply. What may interact with this medicine? Do not take this medicine with any of the following medications: -amprenavir -bosentan -fosamprenavir This medicine may also interact with  the following medications: -aprepitant -barbiturate medicines for inducing sleep or treating seizures -bexarotene -griseofulvin -medicines to treat seizures like carbamazepine, ethotoin, felbamate, oxcarbazepine, phenytoin, topiramate -modafinil -pioglitazone -rifabutin -rifampin -rifapentine -some medicines to treat HIV infection like atazanavir, indinavir, lopinavir, nelfinavir, tipranavir, ritonavir -St. John's wort -warfarin This list may not describe all possible interactions. Give your health care provider a list of all the medicines, herbs, non-prescription drugs, or dietary supplements you use. Also tell them if you smoke, drink alcohol, or use illegal drugs. Some items may interact with your medicine. What should I watch for while using this medicine? Visit your doctor or health care professional for regular check ups. See your doctor if you or your partner has sexual contact with others, becomes HIV positive, or gets a sexual transmitted disease. This product does not protect you against HIV infection (AIDS) or other sexually transmitted diseases. You can check the placement of the IUD yourself by reaching up to the top of your vagina with clean fingers to feel the threads. Do not pull on the threads. It is a good habit to check placement after each menstrual period. Call your doctor right away if you feel more of the IUD than just the threads or if you cannot feel the threads at all. The IUD may come out by itself. You may become pregnant if the device comes out. If you notice that the IUD has come out use a backup birth control method like condoms and call your health care provider. Using tampons will not change the position of the IUD and are okay to use during your period. What side effects may I notice from receiving this medicine?   Side effects that you should report to your doctor or health care professional as soon as possible: -allergic reactions like skin rash, itching or  hives, swelling of the face, lips, or tongue -fever, flu-like symptoms -genital sores -high blood pressure -no menstrual period for 6 weeks during use -pain, swelling, warmth in the leg -pelvic pain or tenderness -severe or sudden headache -signs of pregnancy -stomach cramping -sudden shortness of breath -trouble with balance, talking, or walking -unusual vaginal bleeding, discharge -yellowing of the eyes or skin Side effects that usually do not require medical attention (report to your doctor or health care professional if they continue or are bothersome): -acne -breast pain -change in sex drive or performance -changes in weight -cramping, dizziness, or faintness while the device is being inserted -headache -irregular menstrual bleeding within first 3 to 6 months of use -nausea This list may not describe all possible side effects. Call your doctor for medical advice about side effects. You may report side effects to FDA at 1-800-FDA-1088. Where should I keep my medicine? This does not apply. NOTE: This sheet is a summary. It may not cover all possible information. If you have questions about this medicine, talk to your doctor, pharmacist, or health care provider.    2016, Elsevier/Gold Standard. (2011-10-27 13:54:04)  

## 2016-06-02 ENCOUNTER — Ambulatory Visit
Admission: RE | Admit: 2016-06-02 | Discharge: 2016-06-02 | Disposition: A | Discharge status: Home / Self Care | Payer: Medicaid Other | Source: Ambulatory Visit | Attending: Radiation Oncology | Admitting: Radiation Oncology

## 2016-06-02 ENCOUNTER — Encounter: Payer: Self-pay | Admitting: Radiation Oncology

## 2016-06-02 VITALS — BP 101/66 | HR 68 | Temp 97.8°F | Ht 63.0 in | Wt 107.4 lb

## 2016-06-02 DIAGNOSIS — C44692 Other specified malignant neoplasm of skin of right upper limb, including shoulder: Secondary | ICD-10-CM | POA: Diagnosis not present

## 2016-06-02 NOTE — Progress Notes (Signed)
Radiation Oncology         [phone]) [phone] ________________________________  Name: Gail Tucker MRN: [id number]  Date: 06/02/2016  DOB: September 06, 1976  Follow-Up Visit Note  CC: Ricke Hey, MD  Erroll Luna, MD    ICD-9-CM ICD-10-CM   1. 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 (Dr. Valere Dross)  Narrative:  The patient returns today for routine follow-up. The patient followed up with [facility] on 05/23/16. The patient continued to have daily headaches, therefore, her Lyrica was increased to 50 mg BID. She denies having pain today.  She does report having stiffness, numbness, and itching in her right shoulder and this radiate down her right arm.  sHe states this is chronic in nature.  She also reports having migraines every other day.  She was started on Lyrica by her neurologist, but she does not want to take it due to the side effects/getting addicted to it.  She reports having occasional fatigue.  No right arm weakness reported by patient.   ALLERGIES:  is allergic to tetracyclines & related.  Meds: Current Outpatient Prescriptions  Medication Sig Dispense Refill  . megestrol (MEGACE) 40 MG/ML suspension Take 200 mg by mouth 2 (two) times daily. Reported on 03/02/2016    . Oxycodone HCl 10 MG TABS Take 10 mg by mouth 3 (three) times daily as needed. pain  0  . cetirizine (ZYRTEC ALLERGY) 10 MG tablet Take 1 tablet (10 mg total) by mouth daily. (Patient not taking: Reported on 05/31/2016) 30 tablet 1  . esomeprazole (NEXIUM) 20 MG capsule Take 20 mg by mouth daily as needed (indigestion.). Reported on 03/02/2016    . Nicotine 21-14-7 MG/24HR KIT See  admin instructions.  0  . pregabalin (LYRICA) 25 MG capsule Take 2 capsules (50 mg total) by mouth 2 (two) times daily. (Patient not taking: Reported on 06/02/2016) 120 capsule 5  . propranolol (INDERAL) 20 MG tablet Take 1 tablet (20 mg total) by mouth 2 (two) times daily. (Patient not taking: Reported on 05/31/2016) 60 tablet 3   No current facility-administered medications for this encounter.     Physical Findings: The patient is in no acute distress. Patient is alert and oriented.  height is 5' 3"  (1.6 m) and weight is 107 lb 6.4 oz (48.7 kg). Her oral temperature is 97.8 F (36.6 C). Her blood pressure is 101/66 and her pulse is 68. Her oxygen saturation is 100%.   Lungs are clear to auscultation bilaterally. Heart has regular rate and rhythm. No palpable cervical, supraclavicular, or axillary adenopathy. Muscle strength and symmetry in the upper bilateral extremities 5/5. Right upper back has surgical changes and areas of hyperpigmentation/hypopigmentation in the treatment area. No signs of recurrence.  Lab Findings: Lab Results  Component Value Date   WBC 4.7 02/16/2016   HGB 12.9 02/16/2016   HCT 37.7 02/16/2016   MCV 94.5 02/16/2016   PLT 161 02/16/2016    Radiographic Findings: No results found.  Impression:   No evidence of recurrence on clinical exam today.  Plan: She will return for a routine follow up in 6 months. She will continue her scheduled follow ups with neurology. She was given additional samples of Aquaphor to  apply to the area for the pruritus. I advised her that should could apply lidocaine patches to the right shoulder for temporary relief. She will see her surgeon in December. -----------------------------------  Blair Promise, PhD, MD  This document serves as a record of services personally performed by Gery Pray, MD. It was created on his behalf by Darcus Austin, a trained medical scribe. The creation of this record is based on the scribe's personal  observations and the provider's statements to them. This document has been checked and approved by the attending provider.

## 2016-06-02 NOTE — Progress Notes (Signed)
Gail Tucker here for follow up.  She denies having pain today.  She does report having stiffness, numbness and itching in her right shoulder.  She reports having migraines every other day.  She was started on lyrica by her neurologist but she does not want to take it due to the side effects/getting addicted to it.  She reports having occasional fatigue.  The skin on her posterior right shoulder it scarred.  BP 101/66 (BP Location: Left Arm, Patient Position: Sitting)   Pulse 68   Temp 97.8 F (36.6 C) (Oral)   Ht 5\' 3"  (1.6 m)   Wt 107 lb 6.4 oz (48.7 kg)   LMP 05/17/2016   SpO2 100%   BMI 19.03 kg/m    Wt Readings from Last 3 Encounters:  06/02/16 107 lb 6.4 oz (48.7 kg)  05/31/16 106 lb (48.1 kg)  05/23/16 104 lb (47.2 kg)

## 2016-06-03 ENCOUNTER — Ambulatory Visit (HOSPITAL_COMMUNITY): Admission: RE | Admit: 2016-06-03 | Payer: Medicaid Other | Source: Ambulatory Visit

## 2016-06-04 ENCOUNTER — Other Ambulatory Visit: Payer: Self-pay | Admitting: Obstetrics & Gynecology

## 2016-06-04 DIAGNOSIS — Z1231 Encounter for screening mammogram for malignant neoplasm of breast: Secondary | ICD-10-CM

## 2016-06-06 ENCOUNTER — Ambulatory Visit (HOSPITAL_COMMUNITY): Payer: Medicaid Other | Attending: Obstetrics & Gynecology

## 2016-06-06 ENCOUNTER — Encounter (HOSPITAL_COMMUNITY): Payer: Self-pay

## 2016-06-10 NOTE — Progress Notes (Signed)
I reviewed note and agree with plan.   VIKRAM R. PENUMALLI, MD  Certified in Neurology, Neurophysiology and Neuroimaging  [facility] [address], [address] [place], [place] [address] [phone]   

## 2016-06-16 ENCOUNTER — Ambulatory Visit: Payer: Medicaid Other

## 2016-06-16 ENCOUNTER — Ambulatory Visit
Admission: RE | Admit: 2016-06-16 | Discharge: 2016-06-16 | Disposition: A | Discharge status: Home / Self Care | Payer: Medicaid Other | Source: Ambulatory Visit | Attending: Obstetrics & Gynecology | Admitting: Obstetrics & Gynecology

## 2016-06-16 DIAGNOSIS — Z1231 Encounter for screening mammogram for malignant neoplasm of breast: Secondary | ICD-10-CM

## 2016-09-22 ENCOUNTER — Ambulatory Visit (INDEPENDENT_AMBULATORY_CARE_PROVIDER_SITE_OTHER): Payer: Medicaid Other | Admitting: Adult Health

## 2016-09-22 ENCOUNTER — Encounter: Payer: Self-pay | Admitting: Adult Health

## 2016-09-22 ENCOUNTER — Telehealth: Payer: Self-pay | Admitting: *Deleted

## 2016-09-22 VITALS — BP 110/76 | HR 76 | Resp 14 | Ht 63.0 in | Wt 110.0 lb

## 2016-09-22 DIAGNOSIS — G43119 Migraine with aura, intractable, without status migrainosus: Secondary | ICD-10-CM | POA: Diagnosis not present

## 2016-09-22 NOTE — Patient Instructions (Signed)
Increase Lyrica to 2 tablets in the morning and at night If your symptoms worsen or you develop new symptoms please let us know.

## 2016-09-22 NOTE — Progress Notes (Signed)
PATIENT: Gail Tucker DOB: 1976/04/01  REASON FOR VISIT: follow up- intractable migraine headaches HISTORY FROM: patient  HISTORY OF PRESENT ILLNESS: Gail Tucker is a 40 year old female with a history of intractable migraine headaches. She returns today for follow-up. At the last visit we increased Lyrica to 50 mg twice a day. She reports that she misunderstood the instructions and continued taking Lyrica 25 mg twice a day. She continues to have daily headaches. Her headaches are located in the center of the head. She does report photophobia but denies phonophobia. Occasionally she will have nausea but no vomiting. She also reports that recently she has developed a cough and night sweats. She has not followed with her primary care. 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  HISTORY 05/23/16: Gail Tucker is a 40 year old female with a history of intractable migraine headaches. She returns today for follow-up. She was started on Lyrica 25 mg twice a day. She reports that she's not noticed any benefit with Lyrica. She continues to have a headache daily. Typically located in the temporal regions. She confirms photophobia and phonophobia as well as nausea. Denies vomiting. She states that she is also on oxycodone for pain and typically takes 2 tablets every 6 hours. She returns today for an evaluation.  HISTORY 02/18/16: Gail Tucker is a 40 year old right-handed black female with a history of intractable migraine headache, with daily headaches. 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 The patient has had ongoing discomfort in the right shoulder, the upper right arm, but she also has some left shoulder, neck discomfort, low back discomfort. The patient recently had an effusion the left knee that required treatment. The patient has not been able to tolerate gabapentin  secondary to a metallic taste in the mouth, Topamax was tried for the headaches which was very effective, but she could not tolerate this due to cognitive changes. The patient has been given a trial on Inderal which has helped some, but has not been very effective for the headache or for the shoulder discomfort. The patient was set up for physical therapy, but because she has Medicaid, she could not get any significant number of PT sessions, she ended up having no physical therapy. The patient returns to the office today for an evaluation. The patient claims that her right shoulder and upper arm will throb and then it will become numb.  REVIEW OF SYSTEMS: Out of a complete 14 system review of symptoms, the patient complains only of the following symptoms, and all other reviewed systems are negative.  Appetite change, runny nose, cough, chest tightness, chest pain, eye itching, excessive thirst, constipation, frequent waking, acting out dreams, back pain, neck stiffness, itching, urgency, weakness, tremors, depression  ALLERGIES: Allergies  Allergen Reactions  . Tetracyclines & Related Hives    HOME MEDICATIONS: Outpatient Medications Prior to Visit  Medication Sig Dispense Refill  . cetirizine (ZYRTEC ALLERGY) 10 MG tablet Take 1 tablet (10 mg total) by mouth daily. (Patient not taking: Reported on 05/31/2016) 30 tablet 1  . esomeprazole (NEXIUM) 20 MG capsule Take 20 mg by mouth daily as needed (indigestion.). Reported on 03/02/2016    . megestrol (MEGACE) 40 MG/ML suspension Take 200 mg by mouth 2 (two) times daily. Reported on 03/02/2016    . Nicotine 21-14-7 MG/24HR KIT See admin instructions.  0  . Oxycodone HCl 10 MG TABS Take 10 mg by mouth 3 (  three) times daily as needed. pain  0  . pregabalin (LYRICA) 25 MG capsule Take 2 capsules (50 mg total) by mouth 2 (two) times daily. (Patient not taking: Reported on 06/02/2016) 120 capsule 5  . propranolol (INDERAL) 20 MG tablet Take 1 tablet (20 mg  total) by mouth 2 (two) times daily. (Patient not taking: Reported on 05/31/2016) 60 tablet 3   No facility-administered medications prior to visit.     PAST MEDICAL HISTORY: Past Medical History:  Diagnosis Date  . Cancer ([place]) 06/2015   right  shoulder dermatofibrosarcoma protiberans  . Common migraine with intractable migraine 11/18/2015  . GERD (gastroesophageal reflux disease)    occasional  . Migraines    occasional  . Radiation 08/17/15-09/28/15   right posterior shoulder 5000 cGy with reduced field boost of 600 cG y  . Wears dentures    upper  . Wears partial dentures    lower    PAST SURGICAL HISTORY: Past Surgical History:  Procedure Laterality Date  . DILATION AND CURETTAGE OF UTERUS    . DILATION AND EVACUATION  06/12/2001  . LIPOMA EXCISION Right 05/26/2015   Procedure: EXCISION CYST ON RIGHT SHOULDER;  Surgeon: Erroll Luna, MD;  Location: [facility];  Service: General;  Laterality: Right;  . MULTIPLE TOOTH EXTRACTIONS    . TUBAL LIGATION  09/25/2005    FAMILY HISTORY: Family History  Problem Relation Age of Onset  . Hypertension Mother   . HIV/AIDS Father   . Diabetes Maternal Grandmother   . Hypertension Maternal Grandmother   . Cancer Neg Hx     SOCIAL HISTORY: Social History   Social History  . Marital status: Single    Spouse name: N/A  . Number of children: 3  . Years of education: some coll.   Occupational History  . possible disability soon    Social History Main Topics  . Smoking status: Current Every Day Smoker    Packs/day: 0.25    Years: 10.00    Last attempt to quit: 06/21/2015  . Smokeless tobacco: Never Used  . Alcohol use No  . Drug use: No  . Sexual activity: Not Currently   Other Topics Concern  . Not on file   Social History Narrative   Patient drinks about 7-8 sodas daily.   Patient is right handed.       PHYSICAL EXAM  Vitals:   09/22/16 0837  BP: 110/76  Pulse: 76  Resp: 14  Weight: 110 lb  (49.9 kg)  Height: _0  (1.6 m)   Body mass index is 19.49 kg/m.  Generalized: Well developed, in no acute distress   Neurological examination  Mentation: Alert oriented to time, place, history taking. Follows all commands speech and language fluent Cranial nerve II-XII: Pupils were equal round reactive to light. Extraocular movements were full, visual field were full on confrontational test. Facial sensation and strength were normal. Uvula tongue midline. Head turning and shoulder shrug  were normal and symmetric. Motor: The motor testing reveals 5 over 5 strength of all 4 extremities. Good symmetric motor tone is noted throughout.  Sensory: Sensory testing is intact to soft touch on all 4 extremities. No evidence of extinction is noted.  Coordination: Cerebellar testing reveals good finger-nose-finger and heel-to-shin bilaterally.  Gait and station: Gait is normal. Tandem gait is normal. Romberg is negative. No drift is seen.  Reflexes: Deep tendon reflexes are symmetric and normal bilaterally.   DIAGNOSTIC DATA (LABS, IMAGING, TESTING) - I reviewed  patient records, labs, notes, testing and imaging myself where available.  Lab Results  Component Value Date   WBC 4.7 02/16/2016   HGB 12.9 02/16/2016   HCT 37.7 02/16/2016   MCV 94.5 02/16/2016   PLT 161 02/16/2016      Component Value Date/Time   NA 142 02/16/2016 1336   K 3.5 02/16/2016 1336   CL 110 02/16/2016 1336   CO2 25 02/16/2016 1336   GLUCOSE 87 02/16/2016 1336   BUN 7 02/16/2016 1336   CREATININE 0.75 02/16/2016 1336   CALCIUM 8.9 02/16/2016 1336   GFRNONAA >60 02/16/2016 1336   GFRAA >60 02/16/2016 1336      ASSESSMENT AND PLAN 40 y.o. year old female  has a past medical history of Cancer ([place]) (06/2015); Common migraine with intractable migraine (11/18/2015); GERD (gastroesophageal reflux disease); Migraines; Radiation (08/17/15-09/28/15); Wears dentures; and Wears partial dentures. here with:  1. Migraine  headaches  The patient will increase Lyrica 50 mg twice a day. She is advised that if her headaches do not improve she should let us know. She is also encouraged to follow-up with her primary care in regards to cough and night sweats. She will follow up with our office in 6 months or sooner if needed.     Ward Givens, MSN, NP-C 09/22/2016, 8:37 AM [facility] [address] [place], [place] [address] [phone]

## 2016-09-22 NOTE — Progress Notes (Signed)
I have read the note, and I agree with the clinical assessment and plan.  Gail Tucker   

## 2016-09-22 NOTE — Telephone Encounter (Signed)
On 09-22-16 fax medical records to [facility] it consult note, end of tx note, follow up notes, sim & planning note.

## 2016-11-11 DIAGNOSIS — Z0271 Encounter for disability determination: Secondary | ICD-10-CM

## 2016-12-01 ENCOUNTER — Ambulatory Visit: Payer: Medicaid Other | Admitting: Radiation Oncology

## 2016-12-29 ENCOUNTER — Ambulatory Visit: Admission: RE | Admit: 2016-12-29 | Payer: Medicaid Other | Source: Ambulatory Visit | Admitting: Radiation Oncology

## 2016-12-29 ENCOUNTER — Telehealth: Payer: Self-pay | Admitting: Oncology

## 2016-12-29 NOTE — Telephone Encounter (Signed)
Called patient regarding her appointment today.  She said her appointment was at 8:30 but was listed as 9:30 in the computer.  She said she needs to reschedule as she has to be at work.  Apologized and advised her a scheduler will call her back with a new appointment.  Jimmi verbalized agreement.

## 2017-01-05 ENCOUNTER — Ambulatory Visit: Admission: RE | Admit: 2017-01-05 | Payer: Medicaid Other | Source: Ambulatory Visit | Admitting: Radiation Oncology

## 2017-01-05 ENCOUNTER — Telehealth: Payer: Self-pay | Admitting: *Deleted

## 2017-01-05 NOTE — Telephone Encounter (Signed)
Called , no answer, let phone ring 20 x, no answering machine to pick up to leave message, patient had a 4:45 appt today no show 5:13 PM

## 2017-02-01 ENCOUNTER — Ambulatory Visit: Payer: Medicaid Other | Admitting: Obstetrics & Gynecology

## 2017-03-23 ENCOUNTER — Ambulatory Visit: Payer: Medicaid Other | Admitting: Neurology

## 2017-03-23 ENCOUNTER — Telehealth: Payer: Self-pay | Admitting: Neurology

## 2017-03-23 NOTE — Telephone Encounter (Signed)
This patient did not show for a revisit appointment today. 

## 2017-03-24 ENCOUNTER — Encounter: Payer: Self-pay | Admitting: Neurology

## 2017-05-11 ENCOUNTER — Other Ambulatory Visit: Payer: Self-pay | Admitting: Obstetrics & Gynecology

## 2017-05-11 ENCOUNTER — Other Ambulatory Visit: Payer: Self-pay | Admitting: Family Medicine

## 2017-05-11 DIAGNOSIS — Z1231 Encounter for screening mammogram for malignant neoplasm of breast: Secondary | ICD-10-CM

## 2017-06-19 ENCOUNTER — Ambulatory Visit: Payer: Medicaid Other

## 2017-07-14 ENCOUNTER — Inpatient Hospital Stay: Admission: RE | Admit: 2017-07-14 | Payer: Medicaid Other | Source: Ambulatory Visit

## 2018-04-30 ENCOUNTER — Encounter: Payer: Self-pay | Admitting: *Deleted

## 2018-04-30 NOTE — Progress Notes (Signed)
Call the patient on 04-30-18 to let her know that the medical records are ready to be pick up, they are at [facility]

## 2018-09-20 ENCOUNTER — Emergency Department (HOSPITAL_COMMUNITY)
Admission: EM | Admit: 2018-09-20 | Discharge: 2018-09-20 | Disposition: A | Discharge status: Home / Self Care | Payer: Medicaid Other | Attending: Emergency Medicine | Admitting: Emergency Medicine

## 2018-09-20 ENCOUNTER — Encounter (HOSPITAL_COMMUNITY): Payer: Self-pay | Admitting: Emergency Medicine

## 2018-09-20 DIAGNOSIS — Z85828 Personal history of other malignant neoplasm of skin: Secondary | ICD-10-CM | POA: Diagnosis not present

## 2018-09-20 DIAGNOSIS — K625 Hemorrhage of anus and rectum: Secondary | ICD-10-CM | POA: Diagnosis present

## 2018-09-20 DIAGNOSIS — K644 Residual hemorrhoidal skin tags: Secondary | ICD-10-CM | POA: Diagnosis not present

## 2018-09-20 DIAGNOSIS — Z79899 Other long term (current) drug therapy: Secondary | ICD-10-CM | POA: Insufficient documentation

## 2018-09-20 DIAGNOSIS — F17200 Nicotine dependence, unspecified, uncomplicated: Secondary | ICD-10-CM | POA: Insufficient documentation

## 2018-09-20 LAB — CBC
HCT: 44 % (ref 36.0–46.0)
Hemoglobin: 14.3 g/dL (ref 12.0–15.0)
MCH: 33.5 pg (ref 26.0–34.0)
MCHC: 32.5 g/dL (ref 30.0–36.0)
MCV: 103 fL — ABNORMAL HIGH (ref 80.0–100.0)
Platelets: 177 10*3/uL (ref 150–400)
RBC: 4.27 MIL/uL (ref 3.87–5.11)
RDW: 13.2 % (ref 11.5–15.5)
WBC: 5.2 10*3/uL (ref 4.0–10.5)
nRBC: 0 % (ref 0.0–0.2)

## 2018-09-20 LAB — COMPREHENSIVE METABOLIC PANEL
ALBUMIN: 4.3 g/dL (ref 3.5–5.0)
ALT: 11 U/L (ref 0–44)
AST: 21 U/L (ref 15–41)
Alkaline Phosphatase: 44 U/L (ref 38–126)
Anion gap: 6 (ref 5–15)
BUN: 7 mg/dL (ref 6–20)
CO2: 26 mmol/L (ref 22–32)
CREATININE: 0.69 mg/dL (ref 0.44–1.00)
Calcium: 9 mg/dL (ref 8.9–10.3)
Chloride: 107 mmol/L (ref 98–111)
Glucose, Bld: 108 mg/dL — ABNORMAL HIGH (ref 70–99)
POTASSIUM: 3.7 mmol/L (ref 3.5–5.1)
Sodium: 139 mmol/L (ref 135–145)
Total Bilirubin: 0.6 mg/dL (ref 0.3–1.2)
Total Protein: 7.3 g/dL (ref 6.5–8.1)

## 2018-09-20 LAB — TYPE AND SCREEN
ABO/RH(D): O POS
ANTIBODY SCREEN: NEGATIVE

## 2018-09-20 LAB — I-STAT BETA HCG BLOOD, ED (MC, WL, AP ONLY)

## 2018-09-20 LAB — ABO/RH: ABO/RH(D): O POS

## 2018-09-20 MED ORDER — HYDROCORTISONE 2.5 % RE CREA: TOPICAL_CREAM | RECTAL | 0 refills | Status: DC

## 2018-09-20 MED ORDER — HYDROCORTISONE 2.5 % RE CREA: TOPICAL_CREAM | RECTAL | 0 refills | Status: AC

## 2018-09-20 NOTE — Discharge Instructions (Signed)
Anusol twice daily as prescribed alternated with Preparation H twice daily.  Follow-up with general surgery if not improving in the next week.  The contact information for [facility] surgery has been provided in this discharge summary for you to call and make these arrangements.

## 2018-09-20 NOTE — ED Notes (Signed)
Patient given discharge teaching and verbalized understanding. Patient ambulated out of ED with a steady gait. 

## 2018-09-20 NOTE — ED Triage Notes (Signed)
Pt reports that when she had BMs the other day had bright red blood clots. Today reports having constant bright red bleeding from anus that is leaking. Denies having to strain with BM or having any anal sex.

## 2018-09-20 NOTE — ED Provider Notes (Signed)
[facility] DEPT Provider Note   CSN: [id number] Arrival date & time: 09/20/18  1042     History   Chief Complaint Chief Complaint  Patient presents with  . Rectal Bleeding    HPI Gail Tucker is a 42 y.o. female.  Patient is a 42 year old female with history of migraines.  She presents today with complaints of rectal bleeding.  This is been occurring intermittently for the past week.  It has been occurring with bowel movements, however today began while she was taking her morning walk.  She denies any rectal pain.  She denies any abdominal pain.  She denies feeling lightheaded or dizzy.  The history is provided by the patient.  Rectal Bleeding  Quality:  Bright red Amount:  Moderate Duration:  1 week Timing:  Intermittent Chronicity:  New Context: not anal fissures, not anal penetration, not constipation and not diarrhea   Relieved by:  Nothing Worsened by:  Nothing Ineffective treatments:  None tried   Past Medical History:  Diagnosis Date  . Cancer ([place]) 06/2015   right  shoulder dermatofibrosarcoma protiberans  . Common migraine with intractable migraine 11/18/2015  . GERD (gastroesophageal reflux disease)    occasional  . Migraines    occasional  . Radiation 08/17/15-09/28/15   right posterior shoulder 5000 cGy with reduced field boost of 600 cG y  . Wears dentures    upper  . Wears partial dentures    lower    Patient Active Problem List   Diagnosis Date Noted  . Menometrorrhagia 05/31/2016  . Common migraine with intractable migraine 11/18/2015  . Paresthesia of arm 11/18/2015  . 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 DILATION AND CURETTAGE OF UTERUS    . DILATION AND EVACUATION  06/12/2001  . LIPOMA EXCISION Right 05/26/2015   Procedure: EXCISION CYST ON RIGHT SHOULDER;  Surgeon: Erroll Luna, MD;  Location: [facility];   Service: General;  Laterality: Right;  . MULTIPLE TOOTH EXTRACTIONS    . TUBAL LIGATION  09/25/2005     OB History    Gravida  5   Para  3   Term  3   Preterm      AB      Living        SAB      TAB      Ectopic      Multiple      Live Births  3            Home Medications    Prior to Admission medications   Medication Sig Start Date End Date Taking? Authorizing Provider  cetirizine (ZYRTEC ALLERGY) 10 MG tablet Take 1 tablet (10 mg total) by mouth daily. 02/16/16   Montine Circle, PA-C  megestrol (MEGACE) 40 MG/ML suspension Take 200 mg by mouth 2 (two) times daily. Reported on 03/02/2016    [provider]  Nicotine 21-14-7 MG/24HR KIT See admin instructions. 04/06/16   [provider]  Oxycodone HCl 10 MG TABS Take 10 mg by mouth 3 (three) times daily as needed. pain 02/02/16   [provider]  pregabalin (LYRICA) 25 MG capsule Take 2 capsules (50 mg total) by mouth 2 (two) times daily. 05/23/16   Ward Givens, NP  propranolol (INDERAL) 20 MG tablet Take 1 tablet (20 mg total) by mouth 2 (two) times daily. 01/05/16   Kathrynn Ducking, MD    Family  History Family History  Problem Relation Age of Onset  . Hypertension Mother   . HIV/AIDS Father   . Diabetes Maternal Grandmother   . Hypertension Maternal Grandmother   . Cancer Neg Hx     Social History Social History   Tobacco Use  . Smoking status: Current Every Day Smoker    Packs/day: 0.25    Years: 10.00    Pack years: 2.50    Last attempt to quit: 06/21/2015    Years since quitting: 3.2  . Smokeless tobacco: Never Used  Substance Use Topics  . Alcohol use: No  . Drug use: No     Allergies   Tetracyclines & related   Review of Systems Review of Systems  Gastrointestinal: Positive for hematochezia.  All other systems reviewed and are negative.    Physical Exam Updated Vital Signs BP 102/79 (BP Location: Right Arm)   Pulse 99   Temp 98.3 F (36.8 C)  (Oral)   Resp 17   SpO2 99%   Physical Exam Vitals signs and nursing note reviewed.  Constitutional:      General: She is not in acute distress.    Appearance: She is well-developed. She is not diaphoretic.  HENT:     Head: Normocephalic and atraumatic.  Neck:     Musculoskeletal: Normal range of motion and neck supple.  Cardiovascular:     Rate and Rhythm: Normal rate and regular rhythm.     Heart sounds: No murmur. No friction rub. No gallop.   Pulmonary:     Effort: Pulmonary effort is normal. No respiratory distress.     Breath sounds: Normal breath sounds. No wheezing.  Abdominal:     General: Bowel sounds are normal. There is no distension.     Palpations: Abdomen is soft.     Tenderness: There is no abdominal tenderness.  Genitourinary:    Comments: There are multiple external hemorrhoids noted. Musculoskeletal: Normal range of motion.  Skin:    General: Skin is warm and dry.  Neurological:     Mental Status: She is alert and oriented to person, place, and time.      ED Treatments / Results  Labs (all labs ordered are listed, but only abnormal results are displayed) Labs Reviewed  CBC - Abnormal; Notable for the following components:      Result Value   MCV 103.0 (*)    All other components within normal limits  COMPREHENSIVE METABOLIC PANEL  I-STAT BETA HCG BLOOD, ED ([facility], [facility], [facility] ONLY)  POC OCCULT BLOOD, ED  TYPE AND SCREEN    EKG None  Radiology No results found.  Procedures Procedures (including critical care time)  Medications Ordered in ED Medications - No data to display   Initial Impression / Assessment and Plan / ED Course  I have reviewed the triage vital signs and the nursing notes.  Pertinent labs & imaging results that were available during my care of the patient were reviewed by me and considered in my medical decision making (see chart for details).  Patient with rectal bleeding due to external hemorrhoids.  She has multiple easily  visible, inflamed hemorrhoids on rectal exam.  She will be treated with preparation H, Anusol, and is to follow-up with general surgery if not improving.  Final Clinical Impressions(s) / ED Diagnoses   Final diagnoses:  None    ED Discharge Orders    None       Veryl Speak, MD 09/20/18 1232

## 2019-03-14 ENCOUNTER — Other Ambulatory Visit: Payer: Self-pay | Admitting: Nurse Practitioner

## 2019-03-14 DIAGNOSIS — Z1231 Encounter for screening mammogram for malignant neoplasm of breast: Secondary | ICD-10-CM

## 2019-04-14 ENCOUNTER — Other Ambulatory Visit: Payer: Self-pay

## 2019-04-14 ENCOUNTER — Emergency Department (HOSPITAL_COMMUNITY)
Admission: EM | Admit: 2019-04-14 | Discharge: 2019-04-14 | Disposition: A | Discharge status: Home / Self Care | Payer: Medicaid Other | Attending: Emergency Medicine | Admitting: Emergency Medicine

## 2019-04-14 ENCOUNTER — Encounter (HOSPITAL_COMMUNITY): Payer: Self-pay

## 2019-04-14 DIAGNOSIS — Z202 Contact with and (suspected) exposure to infections with a predominantly sexual mode of transmission: Secondary | ICD-10-CM | POA: Diagnosis present

## 2019-04-14 DIAGNOSIS — R309 Painful micturition, unspecified: Secondary | ICD-10-CM | POA: Diagnosis not present

## 2019-04-14 DIAGNOSIS — Z5321 Procedure and treatment not carried out due to patient leaving prior to being seen by health care provider: Secondary | ICD-10-CM | POA: Insufficient documentation

## 2019-04-14 DIAGNOSIS — N898 Other specified noninflammatory disorders of vagina: Secondary | ICD-10-CM | POA: Insufficient documentation

## 2019-04-14 NOTE — ED Triage Notes (Signed)
Pt states she recently tested positive for herpes. Pt c/o dryness and pressure. Pt states that she is having discomfort with urination. Pt states partner was tested. Pt is upset she had a positive test and partner hadn't told her.  Pt was not given any rx from PCP

## 2019-05-17 ENCOUNTER — Ambulatory Visit: Payer: Medicaid Other

## 2019-08-31 ENCOUNTER — Other Ambulatory Visit: Payer: Self-pay

## 2019-08-31 ENCOUNTER — Emergency Department (HOSPITAL_COMMUNITY)
Admission: EM | Admit: 2019-08-31 | Discharge: 2019-08-31 | Disposition: A | Discharge status: Home / Self Care | Payer: No Typology Code available for payment source | Attending: Emergency Medicine | Admitting: Emergency Medicine

## 2019-08-31 ENCOUNTER — Emergency Department (HOSPITAL_COMMUNITY): Payer: No Typology Code available for payment source

## 2019-08-31 ENCOUNTER — Encounter (HOSPITAL_COMMUNITY): Payer: Self-pay

## 2019-08-31 DIAGNOSIS — F17210 Nicotine dependence, cigarettes, uncomplicated: Secondary | ICD-10-CM | POA: Diagnosis not present

## 2019-08-31 DIAGNOSIS — Y999 Unspecified external cause status: Secondary | ICD-10-CM | POA: Diagnosis not present

## 2019-08-31 DIAGNOSIS — Y92410 Unspecified street and highway as the place of occurrence of the external cause: Secondary | ICD-10-CM | POA: Diagnosis not present

## 2019-08-31 DIAGNOSIS — Z79899 Other long term (current) drug therapy: Secondary | ICD-10-CM | POA: Insufficient documentation

## 2019-08-31 DIAGNOSIS — M791 Myalgia, unspecified site: Secondary | ICD-10-CM | POA: Diagnosis present

## 2019-08-31 DIAGNOSIS — Y939 Activity, unspecified: Secondary | ICD-10-CM | POA: Diagnosis not present

## 2019-08-31 MED ORDER — METHOCARBAMOL 500 MG PO TABS
500.0000 mg | ORAL_TABLET | Freq: Once | ORAL | Status: AC
  Administered 2019-08-31: 500 mg via ORAL
  Filled 2019-08-31: qty 1

## 2019-08-31 MED ORDER — IBUPROFEN 200 MG PO TABS
600.0000 mg | ORAL_TABLET | Freq: Once | ORAL | Status: DC
  Filled 2019-08-31: qty 3

## 2019-08-31 MED ORDER — METHOCARBAMOL 500 MG PO TABS: 500.0000 mg | ORAL_TABLET | Freq: Two times a day (BID) | ORAL | 0 refills | Status: DC

## 2019-08-31 MED ORDER — NAPROXEN 500 MG PO TABS: 500.0000 mg | ORAL_TABLET | Freq: Two times a day (BID) | ORAL | 0 refills | Status: DC
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
## 2020-01-21 ENCOUNTER — Encounter (HOSPITAL_COMMUNITY): Payer: Self-pay

## 2020-01-21 ENCOUNTER — Emergency Department (HOSPITAL_COMMUNITY)
Admission: EM | Admit: 2020-01-21 | Discharge: 2020-01-21 | Disposition: A | Discharge status: Home / Self Care | Payer: Medicaid Other | Attending: Emergency Medicine | Admitting: Emergency Medicine

## 2020-01-21 ENCOUNTER — Other Ambulatory Visit: Payer: Self-pay

## 2020-01-21 DIAGNOSIS — Z79899 Other long term (current) drug therapy: Secondary | ICD-10-CM | POA: Diagnosis not present

## 2020-01-21 DIAGNOSIS — Y9389 Activity, other specified: Secondary | ICD-10-CM | POA: Insufficient documentation

## 2020-01-21 DIAGNOSIS — W260XXA Contact with knife, initial encounter: Secondary | ICD-10-CM | POA: Insufficient documentation

## 2020-01-21 DIAGNOSIS — S61432A Puncture wound without foreign body of left hand, initial encounter: Secondary | ICD-10-CM | POA: Diagnosis not present

## 2020-01-21 DIAGNOSIS — Y999 Unspecified external cause status: Secondary | ICD-10-CM | POA: Insufficient documentation

## 2020-01-21 DIAGNOSIS — Y929 Unspecified place or not applicable: Secondary | ICD-10-CM | POA: Diagnosis not present
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
## 2021-06-22 ENCOUNTER — Emergency Department (HOSPITAL_BASED_OUTPATIENT_CLINIC_OR_DEPARTMENT_OTHER)
Admission: EM | Admit: 2021-06-22 | Discharge: 2021-06-22 | Disposition: A | Discharge status: Home / Self Care | Payer: Medicaid Other | Attending: Emergency Medicine | Admitting: Emergency Medicine

## 2021-06-22 ENCOUNTER — Encounter (HOSPITAL_BASED_OUTPATIENT_CLINIC_OR_DEPARTMENT_OTHER): Payer: Self-pay | Admitting: *Deleted

## 2021-06-22 ENCOUNTER — Other Ambulatory Visit: Payer: Self-pay

## 2021-06-22 DIAGNOSIS — Z85828 Personal history of other malignant neoplasm of skin: Secondary | ICD-10-CM | POA: Diagnosis not present

## 2021-06-22 DIAGNOSIS — F17210 Nicotine dependence, cigarettes, uncomplicated: Secondary | ICD-10-CM | POA: Diagnosis not present

## 2021-06-22 DIAGNOSIS — M79671 Pain in right foot: Secondary | ICD-10-CM | POA: Diagnosis not present

## 2021-06-22 NOTE — ED Provider Notes (Signed)
[facility] Provider Note   CSN: [id number] Arrival date & time: 06/22/21  1637     History Chief Complaint  Patient presents with   Foot Pain    Gail Tucker is a 45 y.o. female presents to the emergency department with a chief complaint of pain to the right sole of her foot.  Pain is present over the last 2 weeks.  Patient rates pain 10/10 on the pain scale.  Pain is worse with weightbearing or touch.  Pain is improved with ibuprofen.  Patient denies any traumatic injury.  Patient denies any swelling, erythema, purulent discharge, warmth, or blister.   Patient reports that she works at [employer] and is on her feet for approximately 12 hours 6 times a week.     Foot Pain      Past Medical History:  Diagnosis Date   Cancer ([place]) 06/2015   right  shoulder dermatofibrosarcoma protiberans   Common migraine with intractable migraine 11/18/2015   GERD (gastroesophageal reflux disease)    occasional   Migraines    occasional   Radiation 08/17/15-09/28/15   right posterior shoulder 5000 cGy with reduced field boost of 85 cG y   Wears dentures    upper   Wears partial dentures    lower    Patient Active Problem List   Diagnosis Date Noted   Menometrorrhagia 05/31/2016   Common migraine with intractable migraine 11/18/2015   Paresthesia of arm 11/18/2015   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    Past Surgical History:  Procedure Laterality Date   DILATION AND CURETTAGE OF UTERUS     DILATION AND EVACUATION  06/12/2001   LIPOMA EXCISION Right 05/26/2015   Procedure: EXCISION CYST ON RIGHT SHOULDER;  Surgeon: Erroll Luna, MD;  Location: [facility];  Service: General;  Laterality: Right;   MULTIPLE TOOTH EXTRACTIONS     TUBAL LIGATION  09/25/2005     OB History     Gravida  5   Para  3   Term  3   Preterm      AB      Living         SAB      IAB      Ectopic      Multiple       Live Births  3           Family History  Problem Relation Age of Onset   Hypertension Mother    HIV/AIDS Father    Diabetes Maternal Grandmother    Hypertension Maternal Grandmother    Cancer Neg Hx     Social History   Tobacco Use   Smoking status: Every Day    Packs/day: 0.25    Years: 10.00    Pack years: 2.50    Types: Cigarettes    Last attempt to quit: 06/21/2015    Years since quitting: 6.0   Smokeless tobacco: Never  Vaping Use   Vaping Use: Never used  Substance Use Topics   Alcohol use: No   Drug use: No    Home Medications Prior to Admission medications   Medication Sig Start Date End Date Taking? Authorizing Provider  cetirizine (ZYRTEC ALLERGY) 10 MG tablet Take 1 tablet (10 mg total) by mouth daily. Patient not taking: Reported on 09/20/2018 02/16/16   Montine Circle, PA-C  hydrocortisone (ANUSOL-HC) 2.5 % rectal cream Apply rectally 2 times daily 09/20/18   Veryl Speak, MD  Ibuprofen-diphenhydrAMINE Cit (  ADVIL PM) 200-38 MG TABS Take 2 tablets by mouth daily as needed (headache).    [provider]  megestrol (MEGACE) 40 MG/ML suspension Take 200 mg by mouth 2 (two) times daily. Reported on 03/02/2016    [provider]  methocarbamol (ROBAXIN) 500 MG tablet Take 1 tablet (500 mg total) by mouth 2 (two) times daily. 08/31/19   Khatri, Hina, PA-C  naproxen (NAPROSYN) 500 MG tablet Take 1 tablet (500 mg total) by mouth 2 (two) times daily. 08/31/19   Delia Heady, PA-C  Nicotine 21-14-7 MG/24HR KIT Apply 1 patch topically every 3 (three) days.  04/06/16   [provider]  Oxycodone HCl 10 MG TABS Take 10 mg by mouth 3 (three) times daily as needed. pain 02/02/16   [provider]  pregabalin (LYRICA) 25 MG capsule Take 2 capsules (50 mg total) by mouth 2 (two) times daily. Patient not taking: Reported on 09/20/2018 05/23/16   Ward Givens, NP  propranolol (INDERAL) 20 MG tablet Take 1 tablet (20 mg total) by mouth 2  (two) times daily. Patient not taking: Reported on 09/20/2018 01/05/16   Kathrynn Ducking, MD    Allergies    Tetracyclines & related  Review of Systems   Review of Systems  Constitutional:  Negative for chills and fever.  Musculoskeletal:  Negative for arthralgias, gait problem and joint swelling.  Skin:  Negative for color change, pallor, rash and wound.   Physical Exam Updated Vital Signs BP (!) 137/94 (BP Location: Right Arm)   Pulse 84   Temp 98.4 F (36.9 C) (Oral)   Resp 18   Ht 5' 3"  (1.6 m)   Wt 53.5 kg   LMP 05/27/2021   SpO2 100%   BMI 20.90 kg/m   Physical Exam Vitals and nursing note reviewed.  Constitutional:      General: She is not in acute distress.    Appearance: She is not ill-appearing, toxic-appearing or diaphoretic.  HENT:     Head: Normocephalic.  Eyes:     General: No scleral icterus.       Right eye: No discharge.        Left eye: No discharge.  Cardiovascular:     Rate and Rhythm: Normal rate.     Pulses:          Dorsalis pedis pulses are 2+ on the right side and 2+ on the left side.  Pulmonary:     Effort: Pulmonary effort is normal.  Feet:     Right foot:     Skin integrity: Callus and dry skin present. No ulcer, blister, skin breakdown, erythema, warmth or fissure.     Toenail Condition: Right toenails are normal.     Left foot:     Skin integrity: Dry skin present. No ulcer, blister, skin breakdown, erythema, warmth, callus or fissure.     Toenail Condition: Left toenails are normal.     Comments: Area of thickened skin to sole of right foot as pictured below.  Patient has tenderness surrounding callus.  No surrounding erythema, induration, swelling, or fluctuance.  No purulent discharge noted. Skin:    General: Skin is warm and dry.  Neurological:     General: No focal deficit present.     Mental Status: She is alert.  Psychiatric:        Behavior: Behavior is cooperative.      ED Results / Procedures / Treatments    Labs (all labs ordered are listed, but only  abnormal results are displayed) Labs Reviewed - No data to display  EKG None  Radiology No results found.  Procedures Procedures   Medications Ordered in ED Medications - No data to display  ED Course  I have reviewed the triage vital signs and the nursing notes.  Pertinent labs & imaging results that were available during my care of the patient were reviewed by me and considered in my medical decision making (see chart for details).    MDM Rules/Calculators/A&P                           Alert 45 year old female in no acute distress, nontoxic appearing.  Presents to emergency department with chief complaint of pain to sole of right foot x2 weeks.  Physical exam as noted above.  Low suspicion for abscess, acute fracture or dislocation, cellulitis.  We will have patient follow-up with podiatry in outpatient setting.  Discussed results, findings, treatment and follow up. Patient advised of return precautions. Patient verbalized understanding and agreed with plan.   Final Clinical Impression(s) / ED Diagnoses Final diagnoses:  Foot pain, right    Rx / DC Orders ED Discharge Orders     None        Dyann Ruddle 06/22/21 2131    Truddie Hidden, MD 06/22/21 [phone]

## 2021-06-22 NOTE — Discharge Instructions (Signed)
You came to the emergency department today to have your foot pain evaluated.  Your physical exam showed a concerning area on the sole of your foot.  Due to this I have given you information to follow-up with a podiatrist.  Get help right away if: Your foot is numb or tingling. Your foot or toes are swollen. Your foot or toes turn white or blue. You have warmth and redness along your foot.

## 2021-06-22 NOTE — ED Triage Notes (Signed)
C/o right foot " knot" with pain x 2 weeks

## 2021-07-19 ENCOUNTER — Other Ambulatory Visit: Payer: Self-pay | Admitting: Nurse Practitioner

## 2021-07-19 DIAGNOSIS — Z1231 Encounter for screening mammogram for malignant neoplasm of breast: Secondary | ICD-10-CM

## 2021-08-07 ENCOUNTER — Ambulatory Visit: Payer: Medicaid Other

## 2021-09-11 ENCOUNTER — Emergency Department (HOSPITAL_BASED_OUTPATIENT_CLINIC_OR_DEPARTMENT_OTHER)
Admission: EM | Admit: 2021-09-11 | Discharge: 2021-09-12 | Disposition: A | Discharge status: Home / Self Care | Payer: Medicaid Other | Attending: Emergency Medicine | Admitting: Emergency Medicine

## 2021-09-11 ENCOUNTER — Other Ambulatory Visit: Payer: Self-pay

## 2021-09-11 ENCOUNTER — Encounter (HOSPITAL_BASED_OUTPATIENT_CLINIC_OR_DEPARTMENT_OTHER): Payer: Self-pay | Admitting: *Deleted

## 2021-09-11 DIAGNOSIS — Z85828 Personal history of other malignant neoplasm of skin: Secondary | ICD-10-CM | POA: Diagnosis not present

## 2021-09-11 DIAGNOSIS — Z87891 Personal history of nicotine dependence: Secondary | ICD-10-CM | POA: Diagnosis not present

## 2021-09-11 DIAGNOSIS — M79671 Pain in right foot: Secondary | ICD-10-CM | POA: Insufficient documentation

## 2021-09-11 NOTE — ED Triage Notes (Signed)
Pt states she has a "blister" to bottom of her right foot for months. States that she was told it was a callous but her pain doctor said they thought it was infected

## 2021-09-12 MED ORDER — CEPHALEXIN 500 MG PO CAPS: 500.0000 mg | ORAL_CAPSULE | Freq: Four times a day (QID) | ORAL | 0 refills | Status: AC

## 2021-09-12 MED ORDER — CEPHALEXIN 250 MG PO CAPS
500.0000 mg | ORAL_CAPSULE | Freq: Once | ORAL | Status: AC
  Administered 2021-09-12: 01:00:00 500 mg via ORAL
  Filled 2021-09-12: qty 2

## 2021-09-12 NOTE — Discharge Instructions (Signed)
Begin taking Keflex as prescribed.  Take ibuprofen 600 mg every 6 hours as needed for pain.  Follow-up with podiatry if not improving.

## 2021-09-12 NOTE — ED Provider Notes (Signed)
[facility] Provider Note   CSN: [id number] Arrival date & time: 09/11/21  2134     History Chief Complaint  Patient presents with   Foot Pain    Gail Tucker is a 45 y.o. female.  Patient is a 45 year old female with past medical history of GERD, migraines.  Patient presenting today for evaluation of right foot pain.  She has had a blister/callused area to the bottom of her foot that has been present for several months.  It is becoming more painful and her pain management doctor was concerned it may be getting infected.  Patient denies any fevers or chills.  She denies any new injury or trauma.  She does report being on her feet multiple hours per day at work at [employer].  She was seen here just over a month ago with similar complaints, however has not improved with recommendations made.  She was referred to a podiatrist, however there are no appointments available until January.  The history is provided by the patient.      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    Past Surgical History:  Procedure Laterality Date   DILATION AND CURETTAGE OF UTERUS     DILATION AND EVACUATION  06/12/2001   LIPOMA EXCISION Right 05/26/2015   Procedure: EXCISION CYST ON RIGHT SHOULDER;  Surgeon: Erroll Luna, MD;  Location: [facility];   Service: General;  Laterality: Right;   MULTIPLE TOOTH EXTRACTIONS     TUBAL LIGATION  09/25/2005     OB History     Gravida  5   Para  3   Term  3   Preterm      AB      Living         SAB      IAB      Ectopic      Multiple      Live Births  3           Family History  Problem Relation Age of Onset   Hypertension Mother    HIV/AIDS Father    Diabetes Maternal Grandmother    Hypertension Maternal Grandmother    Cancer Neg Hx     Social History   Tobacco Use   Smoking status: Former    Packs/day: 0.25    Years: 10.00    Pack years: 2.50    Types: Cigarettes    Quit date: 06/21/2015    Years since quitting: 6.2   Smokeless tobacco: Never  Vaping Use   Vaping Use: Every day  Substance Use Topics   Alcohol use: No   Drug use: No    Home Medications Prior to Admission medications   Medication Sig Start Date End Date Taking? Authorizing Provider  cetirizine (ZYRTEC ALLERGY) 10 MG tablet Take 1 tablet (10  mg total) by mouth daily. Patient not taking: Reported on 09/20/2018 02/16/16   Montine Circle, PA-C  hydrocortisone (ANUSOL-HC) 2.5 % rectal cream Apply rectally 2 times daily 09/20/18   Veryl Speak, MD  Ibuprofen-diphenhydrAMINE Cit (ADVIL PM) 200-38 MG TABS Take 2 tablets by mouth daily as needed (headache).    [provider]  megestrol (MEGACE) 40 MG/ML suspension Take 200 mg by mouth 2 (two) times daily. Reported on 03/02/2016    [provider]  methocarbamol (ROBAXIN) 500 MG tablet Take 1 tablet (500 mg total) by mouth 2 (two) times daily. 08/31/19   Khatri, Hina, PA-C  naproxen (NAPROSYN) 500 MG tablet Take 1 tablet (500 mg total) by mouth 2 (two) times daily. 08/31/19   Delia Heady, PA-C  Nicotine 21-14-7 MG/24HR KIT Apply 1 patch topically every 3 (three) days.  04/06/16   [provider]  Oxycodone HCl 10 MG TABS Take 10 mg by mouth 3 (three) times daily as needed. pain 02/02/16   [provider]   pregabalin (LYRICA) 25 MG capsule Take 2 capsules (50 mg total) by mouth 2 (two) times daily. Patient not taking: Reported on 09/20/2018 05/23/16   Ward Givens, NP  propranolol (INDERAL) 20 MG tablet Take 1 tablet (20 mg total) by mouth 2 (two) times daily. Patient not taking: Reported on 09/20/2018 01/05/16   Kathrynn Ducking, MD    Allergies    Tetracyclines & related and Acetaminophen  Review of Systems   Review of Systems  All other systems reviewed and are negative.  Physical Exam Updated Vital Signs BP 98/76 (BP Location: Left Arm)   Pulse 83   Temp 98.1 F (36.7 C) (Oral)   Resp 18   Ht _0  (1.6 m)   Wt 54.9 kg   LMP 09/04/2021   SpO2 100%   BMI 21.43 kg/m   Physical Exam Vitals and nursing note reviewed.  Constitutional:      General: She is not in acute distress.    Appearance: Normal appearance. She is not ill-appearing.  HENT:     Head: Normocephalic and atraumatic.  Pulmonary:     Effort: Pulmonary effort is normal.  Musculoskeletal:     Comments: To the plantar surface of the right foot in the area of the distal fifth metatarsal, there is an area of thickened skin with mild surrounding erythema and warmth.  There is no ulcer or drainage.  Skin:    General: Skin is warm and dry.  Neurological:     Mental Status: She is alert.    ED Results / Procedures / Treatments   Labs (all labs ordered are listed, but only abnormal results are displayed) Labs Reviewed - No data to display  EKG None  Radiology No results found.  Procedures Procedures   Medications Ordered in ED Medications - No data to display  ED Course  I have reviewed the triage vital signs and the nursing notes.  Pertinent labs & imaging results that were available during my care of the patient were reviewed by me and considered in my medical decision making (see chart for details).    MDM Rules/Calculators/A&P  Patient with a possible infected callus to the bottom of the  right foot.  Patient to be treated with Keflex, warm soaks, and follow-up with podiatry if not improving.  Final Clinical Impression(s) / ED Diagnoses Final diagnoses:  None    Rx / DC Orders ED Discharge Orders     None  Veryl Speak, MD 09/12/21 Laureen Abrahams

## 2023-06-01 DIAGNOSIS — I10 Essential (primary) hypertension: Secondary | ICD-10-CM | POA: Diagnosis not present

## 2023-06-01 DIAGNOSIS — Z8249 Family history of ischemic heart disease and other diseases of the circulatory system: Secondary | ICD-10-CM | POA: Diagnosis not present

## 2023-06-01 DIAGNOSIS — Z91199 Patient's noncompliance with other medical treatment and regimen due to unspecified reason: Secondary | ICD-10-CM | POA: Diagnosis not present

## 2023-06-01 DIAGNOSIS — F419 Anxiety disorder, unspecified: Secondary | ICD-10-CM | POA: Diagnosis not present

## 2023-06-01 DIAGNOSIS — G8929 Other chronic pain: Secondary | ICD-10-CM | POA: Diagnosis not present

## 2023-06-01 DIAGNOSIS — Z720 Tobacco use: Secondary | ICD-10-CM | POA: Diagnosis not present

## 2023-06-01 DIAGNOSIS — Z85828 Personal history of other malignant neoplasm of skin: Secondary | ICD-10-CM | POA: Diagnosis not present

## 2023-06-01 DIAGNOSIS — Z973 Presence of spectacles and contact lenses: Secondary | ICD-10-CM | POA: Diagnosis not present

## 2023-06-01 DIAGNOSIS — Z823 Family history of stroke: Secondary | ICD-10-CM | POA: Diagnosis not present

## 2023-06-01 DIAGNOSIS — Z818 Family history of other mental and behavioral disorders: Secondary | ICD-10-CM | POA: Diagnosis not present

## 2023-12-12 ENCOUNTER — Other Ambulatory Visit (HOSPITAL_BASED_OUTPATIENT_CLINIC_OR_DEPARTMENT_OTHER): Payer: Self-pay | Admitting: Obstetrics & Gynecology

## 2023-12-12 ENCOUNTER — Inpatient Hospital Stay (HOSPITAL_BASED_OUTPATIENT_CLINIC_OR_DEPARTMENT_OTHER): Admission: RE | Admit: 2023-12-12 | Payer: Medicaid Other | Source: Ambulatory Visit | Admitting: Radiology

## 2023-12-12 ENCOUNTER — Encounter (HOSPITAL_BASED_OUTPATIENT_CLINIC_OR_DEPARTMENT_OTHER): Payer: Self-pay

## 2023-12-12 DIAGNOSIS — Z1231 Encounter for screening mammogram for malignant neoplasm of breast: Secondary | ICD-10-CM

## 2024-03-19 ENCOUNTER — Encounter: Admitting: Nurse Practitioner

## 2024-07-21 ENCOUNTER — Emergency Department (HOSPITAL_COMMUNITY)
Admission: EM | Admit: 2024-07-21 | Discharge: 2024-07-21 | Disposition: A | Discharge status: Home / Self Care | Attending: Emergency Medicine | Admitting: Emergency Medicine

## 2024-07-21 ENCOUNTER — Emergency Department (HOSPITAL_COMMUNITY)

## 2024-07-21 DIAGNOSIS — S20212A Contusion of left front wall of thorax, initial encounter: Secondary | ICD-10-CM | POA: Insufficient documentation

## 2024-07-21 DIAGNOSIS — R079 Chest pain, unspecified: Secondary | ICD-10-CM | POA: Diagnosis present

## 2024-07-21 DIAGNOSIS — S298XXA Other specified injuries of thorax, initial encounter: Secondary | ICD-10-CM

## 2024-07-21 DIAGNOSIS — Y92410 Unspecified street and highway as the place of occurrence of the external cause: Secondary | ICD-10-CM | POA: Insufficient documentation

## 2024-07-21 MED ORDER — METHOCARBAMOL 500 MG PO TABS
500.0000 mg | ORAL_TABLET | Freq: Once | ORAL | Status: AC
  Administered 2024-07-21: 500 mg via ORAL
  Filled 2024-07-21: qty 1

## 2024-07-21 MED ORDER — NAPROXEN 500 MG PO TABS
500.0000 mg | ORAL_TABLET | Freq: Once | ORAL | Status: AC
  Administered 2024-07-21: 500 mg via ORAL
  Filled 2024-07-21: qty 1

## 2024-07-21 MED ORDER — OXYCODONE HCL 5 MG PO TABS
5.0000 mg | ORAL_TABLET | Freq: Once | ORAL | Status: AC
  Administered 2024-07-21: 5 mg via ORAL
  Filled 2024-07-21: qty 1

## 2024-07-21 MED ORDER — METHOCARBAMOL 500 MG PO TABS: 500.0000 mg | ORAL_TABLET | Freq: Two times a day (BID) | ORAL | 0 refills | Status: AC

## 2024-07-21 MED ORDER — NAPROXEN 500 MG PO TABS: 500.0000 mg | ORAL_TABLET | Freq: Two times a day (BID) | ORAL | 0 refills | Status: AC
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
